# Patient Record
Sex: Female | Born: 1975 | Race: Black or African American | Hispanic: No | Marital: Single | Smoking: Current some day smoker
Health system: Southern US, Community
[De-identification: ages and names within clinical notes are randomized; demographics above are authoritative.]

## PROBLEM LIST (undated history)

## (undated) ENCOUNTER — Inpatient Hospital Stay (HOSPITAL_COMMUNITY): Payer: Self-pay

## (undated) ENCOUNTER — Inpatient Hospital Stay (HOSPITAL_COMMUNITY): Payer: Medicaid Other | Admitting: Obstetrics & Gynecology

## (undated) DIAGNOSIS — F329 Major depressive disorder, single episode, unspecified: Secondary | ICD-10-CM

## (undated) DIAGNOSIS — F32A Depression, unspecified: Secondary | ICD-10-CM

## (undated) DIAGNOSIS — F1010 Alcohol abuse, uncomplicated: Secondary | ICD-10-CM

## (undated) HISTORY — PX: LEG SURGERY: SHX1003

---

## 1997-12-01 ENCOUNTER — Emergency Department (HOSPITAL_COMMUNITY): Admission: EM | Admit: 1997-12-01 | Discharge: 1997-12-01 | Payer: Self-pay | Admitting: Emergency Medicine

## 1999-01-04 ENCOUNTER — Emergency Department (HOSPITAL_COMMUNITY): Admission: EM | Admit: 1999-01-04 | Discharge: 1999-01-04 | Payer: Self-pay | Admitting: Emergency Medicine

## 1999-02-20 ENCOUNTER — Emergency Department (HOSPITAL_COMMUNITY): Admission: EM | Admit: 1999-02-20 | Discharge: 1999-02-20 | Payer: Self-pay | Admitting: Emergency Medicine

## 1999-02-20 ENCOUNTER — Encounter: Payer: Self-pay | Admitting: Emergency Medicine

## 1999-09-17 ENCOUNTER — Emergency Department (HOSPITAL_COMMUNITY): Admission: EM | Admit: 1999-09-17 | Discharge: 1999-09-17 | Payer: Self-pay | Admitting: Emergency Medicine

## 2002-07-07 ENCOUNTER — Encounter: Payer: Self-pay | Admitting: Emergency Medicine

## 2002-07-07 ENCOUNTER — Emergency Department (HOSPITAL_COMMUNITY): Admission: EM | Admit: 2002-07-07 | Discharge: 2002-07-07 | Payer: Self-pay | Admitting: Emergency Medicine

## 2003-01-20 ENCOUNTER — Emergency Department (HOSPITAL_COMMUNITY): Admission: AD | Admit: 2003-01-20 | Discharge: 2003-01-20 | Payer: Self-pay | Admitting: Emergency Medicine

## 2003-10-23 ENCOUNTER — Inpatient Hospital Stay (HOSPITAL_COMMUNITY): Admission: AD | Admit: 2003-10-23 | Discharge: 2003-10-23 | Payer: Self-pay | Admitting: Obstetrics and Gynecology

## 2004-01-11 ENCOUNTER — Inpatient Hospital Stay (HOSPITAL_COMMUNITY): Admission: AD | Admit: 2004-01-11 | Discharge: 2004-01-15 | Payer: Self-pay | Admitting: Obstetrics and Gynecology

## 2004-01-11 ENCOUNTER — Ambulatory Visit: Payer: Self-pay | Admitting: Obstetrics and Gynecology

## 2004-01-13 ENCOUNTER — Ambulatory Visit: Payer: Self-pay | Admitting: Obstetrics and Gynecology

## 2004-05-26 ENCOUNTER — Emergency Department (HOSPITAL_COMMUNITY): Admission: EM | Admit: 2004-05-26 | Discharge: 2004-05-26 | Payer: Self-pay | Admitting: Emergency Medicine

## 2005-07-13 ENCOUNTER — Ambulatory Visit (HOSPITAL_COMMUNITY): Admission: RE | Admit: 2005-07-13 | Discharge: 2005-07-13 | Payer: Self-pay | Admitting: *Deleted

## 2005-07-13 ENCOUNTER — Ambulatory Visit: Payer: Self-pay | Admitting: *Deleted

## 2005-07-27 ENCOUNTER — Ambulatory Visit (HOSPITAL_COMMUNITY): Admission: RE | Admit: 2005-07-27 | Discharge: 2005-07-27 | Payer: Self-pay | Admitting: *Deleted

## 2005-08-03 ENCOUNTER — Inpatient Hospital Stay (HOSPITAL_COMMUNITY): Admission: AD | Admit: 2005-08-03 | Discharge: 2005-08-04 | Payer: Self-pay | Admitting: Obstetrics and Gynecology

## 2005-08-03 ENCOUNTER — Ambulatory Visit: Payer: Self-pay | Admitting: Certified Nurse Midwife

## 2005-08-26 ENCOUNTER — Inpatient Hospital Stay: Admission: AD | Admit: 2005-08-26 | Discharge: 2005-08-26 | Payer: Self-pay | Admitting: Obstetrics & Gynecology

## 2005-08-26 ENCOUNTER — Ambulatory Visit: Payer: Self-pay | Admitting: Obstetrics and Gynecology

## 2005-11-13 ENCOUNTER — Inpatient Hospital Stay (HOSPITAL_COMMUNITY): Admission: AD | Admit: 2005-11-13 | Discharge: 2005-11-13 | Payer: Self-pay | Admitting: Family Medicine

## 2005-11-28 ENCOUNTER — Ambulatory Visit: Payer: Self-pay | Admitting: Obstetrics and Gynecology

## 2005-11-28 ENCOUNTER — Inpatient Hospital Stay (HOSPITAL_COMMUNITY): Admission: AD | Admit: 2005-11-28 | Discharge: 2005-11-28 | Payer: Self-pay | Admitting: *Deleted

## 2005-12-28 ENCOUNTER — Inpatient Hospital Stay (HOSPITAL_COMMUNITY): Admission: AD | Admit: 2005-12-28 | Discharge: 2005-12-29 | Payer: Self-pay | Admitting: Obstetrics and Gynecology

## 2005-12-29 ENCOUNTER — Inpatient Hospital Stay (HOSPITAL_COMMUNITY): Admission: AD | Admit: 2005-12-29 | Discharge: 2006-01-01 | Payer: Self-pay | Admitting: Family Medicine

## 2005-12-29 ENCOUNTER — Ambulatory Visit: Payer: Self-pay | Admitting: Certified Nurse Midwife

## 2006-01-30 ENCOUNTER — Emergency Department (HOSPITAL_COMMUNITY): Admission: EM | Admit: 2006-01-30 | Discharge: 2006-01-30 | Payer: Self-pay | Admitting: Emergency Medicine

## 2006-04-22 ENCOUNTER — Emergency Department (HOSPITAL_COMMUNITY): Admission: EM | Admit: 2006-04-22 | Discharge: 2006-04-22 | Payer: Self-pay | Admitting: Emergency Medicine

## 2006-04-26 ENCOUNTER — Inpatient Hospital Stay (HOSPITAL_COMMUNITY): Admission: EM | Admit: 2006-04-26 | Discharge: 2006-04-30 | Payer: Self-pay | Admitting: *Deleted

## 2006-04-26 ENCOUNTER — Ambulatory Visit: Payer: Self-pay | Admitting: *Deleted

## 2006-07-22 ENCOUNTER — Emergency Department (HOSPITAL_COMMUNITY): Admission: EM | Admit: 2006-07-22 | Discharge: 2006-07-22 | Payer: Self-pay | Admitting: Emergency Medicine

## 2007-08-22 ENCOUNTER — Emergency Department (HOSPITAL_COMMUNITY): Admission: EM | Admit: 2007-08-22 | Discharge: 2007-08-22 | Payer: Self-pay | Admitting: Emergency Medicine

## 2007-09-12 ENCOUNTER — Ambulatory Visit (HOSPITAL_BASED_OUTPATIENT_CLINIC_OR_DEPARTMENT_OTHER): Admission: RE | Admit: 2007-09-12 | Discharge: 2007-09-12 | Payer: Self-pay | Admitting: Orthopedic Surgery

## 2008-02-01 ENCOUNTER — Emergency Department (HOSPITAL_COMMUNITY): Admission: EM | Admit: 2008-02-01 | Discharge: 2008-02-01 | Payer: Self-pay | Admitting: Emergency Medicine

## 2008-03-11 ENCOUNTER — Emergency Department (HOSPITAL_COMMUNITY): Admission: EM | Admit: 2008-03-11 | Discharge: 2008-03-11 | Payer: Self-pay | Admitting: Emergency Medicine

## 2008-03-13 ENCOUNTER — Emergency Department (HOSPITAL_COMMUNITY): Admission: EM | Admit: 2008-03-13 | Discharge: 2008-03-13 | Payer: Self-pay | Admitting: Family Medicine

## 2008-06-02 ENCOUNTER — Emergency Department (HOSPITAL_COMMUNITY): Admission: EM | Admit: 2008-06-02 | Discharge: 2008-06-02 | Payer: Self-pay | Admitting: Emergency Medicine

## 2009-08-13 ENCOUNTER — Emergency Department (HOSPITAL_COMMUNITY): Admission: EM | Admit: 2009-08-13 | Discharge: 2009-08-13 | Payer: Self-pay | Admitting: Emergency Medicine

## 2009-12-24 ENCOUNTER — Emergency Department (HOSPITAL_COMMUNITY): Admission: EM | Admit: 2009-12-24 | Discharge: 2009-12-24 | Payer: Self-pay | Admitting: Emergency Medicine

## 2010-01-10 ENCOUNTER — Emergency Department (HOSPITAL_COMMUNITY): Admission: EM | Admit: 2010-01-10 | Discharge: 2010-01-11 | Payer: Self-pay | Admitting: Emergency Medicine

## 2010-03-05 ENCOUNTER — Emergency Department (HOSPITAL_COMMUNITY): Admission: EM | Admit: 2010-03-05 | Discharge: 2010-03-05 | Payer: Self-pay | Admitting: Emergency Medicine

## 2010-03-09 ENCOUNTER — Emergency Department (HOSPITAL_COMMUNITY): Admission: EM | Admit: 2010-03-09 | Discharge: 2010-03-09 | Payer: Self-pay | Admitting: Emergency Medicine

## 2010-04-11 ENCOUNTER — Emergency Department (HOSPITAL_COMMUNITY): Admission: EM | Admit: 2010-04-11 | Discharge: 2010-04-11 | Payer: Self-pay | Admitting: Emergency Medicine

## 2010-05-09 ENCOUNTER — Emergency Department (HOSPITAL_COMMUNITY)
Admission: EM | Admit: 2010-05-09 | Discharge: 2010-05-10 | Payer: Self-pay | Source: Home / Self Care | Admitting: Emergency Medicine

## 2010-06-06 ENCOUNTER — Emergency Department (HOSPITAL_COMMUNITY)
Admission: EM | Admit: 2010-06-06 | Discharge: 2010-06-06 | Payer: Self-pay | Source: Home / Self Care | Admitting: Emergency Medicine

## 2010-06-21 ENCOUNTER — Encounter: Payer: Self-pay | Admitting: *Deleted

## 2010-08-10 LAB — GC/CHLAMYDIA PROBE AMP, GENITAL: Chlamydia, DNA Probe: NEGATIVE

## 2010-08-10 LAB — URINALYSIS, ROUTINE W REFLEX MICROSCOPIC
Hgb urine dipstick: NEGATIVE
Nitrite: NEGATIVE

## 2010-08-10 LAB — POCT I-STAT, CHEM 8
BUN: 10 mg/dL (ref 6–23)
Calcium, Ion: 1.03 mmol/L — ABNORMAL LOW (ref 1.12–1.32)
Glucose, Bld: 83 mg/dL (ref 70–99)
Potassium: 4.2 mEq/L (ref 3.5–5.1)
Sodium: 141 mEq/L (ref 135–145)
TCO2: 28 mmol/L (ref 0–100)

## 2010-08-10 LAB — POCT PREGNANCY, URINE: Preg Test, Ur: NEGATIVE

## 2010-08-10 LAB — WET PREP, GENITAL

## 2010-08-11 LAB — URINALYSIS, ROUTINE W REFLEX MICROSCOPIC
Bilirubin Urine: NEGATIVE
Glucose, UA: NEGATIVE mg/dL
Hgb urine dipstick: NEGATIVE
Ketones, ur: NEGATIVE mg/dL
Protein, ur: NEGATIVE mg/dL
pH: 5.5 (ref 5.0–8.0)

## 2010-08-11 LAB — POCT I-STAT, CHEM 8
Calcium, Ion: 1.07 mmol/L — ABNORMAL LOW (ref 1.12–1.32)
Creatinine, Ser: 1.2 mg/dL (ref 0.4–1.2)
HCT: 39 % (ref 36.0–46.0)
Potassium: 3.9 mEq/L (ref 3.5–5.1)

## 2010-08-11 LAB — URINE CULTURE

## 2010-08-15 LAB — COMPREHENSIVE METABOLIC PANEL
Alkaline Phosphatase: 44 U/L (ref 39–117)
CO2: 26 mEq/L (ref 19–32)
GFR calc non Af Amer: >60 mL/min (ref >60)
Potassium: 3.9 mEq/L (ref 3.5–5.1)
Sodium: 137 mEq/L (ref 135–145)
Total Protein: 7.3 g/dL (ref 6.0–8.3)

## 2010-08-15 LAB — CBC
Hemoglobin: 12.3 g/dL (ref 12.0–15.0)
MCH: 29 pg (ref 26.0–34.0)
MCHC: 33.6 g/dL (ref 30.0–36.0)
Platelets: 209 10*3/uL (ref 150–400)
RBC: 4.26 MIL/uL (ref 3.87–5.11)
RDW: 14.5 % (ref 11.5–15.5)

## 2010-08-15 LAB — URINALYSIS, ROUTINE W REFLEX MICROSCOPIC
Bilirubin Urine: NEGATIVE
Glucose, UA: NEGATIVE mg/dL
Hgb urine dipstick: NEGATIVE
Nitrite: NEGATIVE
Protein, ur: NEGATIVE mg/dL
Specific Gravity, Urine: 1.017 (ref 1.005–1.030)
pH: 5.5 (ref 5.0–8.0)

## 2010-08-15 LAB — D-DIMER, QUANTITATIVE: D-Dimer, Quant: <0.22 ug/mL-FEU (ref 0.00–0.48)

## 2010-08-15 LAB — DIFFERENTIAL
Basophils Relative: 0 % (ref 0–1)
Eosinophils Relative: 2 % (ref 0–5)
Monocytes Relative: 5 % (ref 3–12)
Neutro Abs: 4.3 10*3/uL (ref 1.7–7.7)
Neutrophils Relative %: 69 % (ref 43–77)

## 2010-08-15 LAB — POCT CARDIAC MARKERS
Myoglobin, poc: 43.3 ng/mL (ref 12–200)
Troponin i, poc: <0.05 ng/mL (ref 0.00–0.09)

## 2010-08-19 ENCOUNTER — Inpatient Hospital Stay (HOSPITAL_COMMUNITY)
Admission: AD | Admit: 2010-08-19 | Discharge: 2010-08-23 | DRG: 897 | Disposition: A | Discharge status: Home / Self Care | Payer: PRIVATE HEALTH INSURANCE | Attending: Psychiatry | Admitting: Psychiatry

## 2010-08-19 DIAGNOSIS — F1020 Alcohol dependence, uncomplicated: Principal | ICD-10-CM

## 2010-08-19 DIAGNOSIS — F1411 Cocaine abuse, in remission: Secondary | ICD-10-CM

## 2010-08-19 DIAGNOSIS — Z87440 Personal history of urinary (tract) infections: Secondary | ICD-10-CM

## 2010-08-20 DIAGNOSIS — F1020 Alcohol dependence, uncomplicated: Secondary | ICD-10-CM

## 2010-08-20 LAB — URINALYSIS, ROUTINE W REFLEX MICROSCOPIC
Glucose, UA: NEGATIVE mg/dL
Ketones, ur: NEGATIVE mg/dL
Protein, ur: NEGATIVE mg/dL

## 2010-08-20 LAB — COMPREHENSIVE METABOLIC PANEL
ALT: 41 U/L — ABNORMAL HIGH (ref 0–35)
Alkaline Phosphatase: 47 U/L (ref 39–117)
CO2: 28 mEq/L (ref 19–32)
GFR calc non Af Amer: >60 mL/min (ref >60)
Glucose, Bld: 120 mg/dL — ABNORMAL HIGH (ref 70–99)
Potassium: 4 mEq/L (ref 3.5–5.1)
Sodium: 137 mEq/L (ref 135–145)

## 2010-08-23 LAB — URINALYSIS, ROUTINE W REFLEX MICROSCOPIC
Bilirubin Urine: NEGATIVE
Hgb urine dipstick: NEGATIVE
Ketones, ur: NEGATIVE mg/dL
Nitrite: NEGATIVE
pH: 6 (ref 5.0–8.0)

## 2010-08-23 LAB — PREGNANCY, URINE: Preg Test, Ur: NEGATIVE

## 2010-08-23 LAB — URINE MICROSCOPIC-ADD ON

## 2010-08-26 NOTE — Discharge Summary (Signed)
  NAMEHANIYAH, MACIOLEK               ACCOUNT NO.:  [id number]  MEDICAL RECORD NO.:  [id number]           PATIENT TYPE:  I  LOCATION:  0300                          FACILITY:  [facility]  PHYSICIAN:  Anselm Jungling, MD  DATE OF BIRTH:  [birth year]  DATE OF ADMISSION:  08/19/2010 DATE OF DISCHARGE:  08/23/2010                              DISCHARGE SUMMARY   IDENTIFYING DATA AND REASON FOR ADMISSION:  This was an inpatient psychiatric admission for Jaree, a 35 year old, single, African American female, who was referred by [facility] for treatment of alcohol dependence.  Please refer to the admission note for further details pertaining to the symptoms, circumstances, and history that led to her hospitalization.  She was given an initial AXIS I diagnosis of alcohol dependence.  MEDICAL AND LABORATORY:  The patient was medically and physically assessed by the psychiatric nurse practitioner.  She was in good health without any active or chronic medical problems.  There were no significant medical issues during this stay, outside of her medical detoxification for alcohol dependence.  HOSPITAL COURSE:  The patient was admitted to the adult inpatient psychiatric service.  She presented as a well-nourished, normally- developed, adult woman, who was pleasant, cooperative, and fully oriented, without any signs or symptoms of psychosis, delirium, or thought disorder.  Her mood was sad.  There was no suicidal ideation at any time during her stay.  She was still participating in the treatment program, which included therapeutic groups and activities geared towards 12-step recovery.  Her detoxification, which was mediated by a standard Librium taper, was relatively uneventful.  She was interested in returning to [facility] for residential treatment, but they were unwilling to accept her, due to the nature of her behavior at their facility when she initially presented there, that is, she  was slightly agitated and intoxicated.  However, she did agree to the following aftercare plan.  AFTERCARE:  The patient was to follow up with [facility] outpatient program on August 26, 2010.  DISCHARGE MEDICATIONS:  None.  DISCHARGE DIAGNOSES:  AXIS I:  Alcohol dependence.  AXIS II:  Deferred.  AXIS III:  No acute or chronic illnesses.  AXIS IV:  Stressors severe.  AXIS V:  Global Assessment of Functioning on discharge 50.  A suicide risk assessment was completed at the time of discharge and she was felt to be at minimal risk.     Anselm Jungling, MD     SPB/MEDQ  D:  08/24/2010  T:  08/24/2010  Job:  [id number]  Electronically Signed by Geralyn Flash MD on 08/26/2010 07:44:21 AM

## 2010-09-02 ENCOUNTER — Emergency Department (HOSPITAL_COMMUNITY)
Admission: EM | Admit: 2010-09-02 | Discharge: 2010-09-02 | Discharge status: Against Medical Advice | Payer: Medicaid Other | Attending: Emergency Medicine | Admitting: Emergency Medicine

## 2010-09-02 DIAGNOSIS — M79609 Pain in unspecified limb: Secondary | ICD-10-CM | POA: Insufficient documentation

## 2010-09-02 DIAGNOSIS — R109 Unspecified abdominal pain: Secondary | ICD-10-CM | POA: Insufficient documentation

## 2010-09-30 NOTE — H&P (Signed)
[name], HASAN               ACCOUNT NO.:  [id number]  MEDICAL RECORD NO.:  [id number]           PATIENT TYPE:  I  LOCATION:  0300                          [facility]ACILITY:  [facility]  PHYSICIAN:  Anselm Jungling, MD  DATE OF BIRTH:  [birth year]  DATE OF ADMISSION:  08/19/2010 DATE OF DISCHARGE:                      PSYCHIATRIC ADMISSION ASSESSMENT   IDENTIFICATION:  A 35 year old African-American female single.  This is a voluntary admission.  HISTORY OF PRESENT ILLNESS:  Breeann presents requesting detox from alcohol.  She has been drinking alcohol with her last drink on Tuesday March 20.  Says that she was getting anxious and shaky and was trying to stop on her own but felt that she could not.  She has been drinking for about a year, not drinking large amounts most every day.  She is motivated for sobriety by wanting to get her children back from the custody of her stepmother.  She denies any suicidal thoughts.  No homicidal thoughts.  PAST PSYCHIATRIC HISTORY:  Previous admission to [facility] in December 2007, also for mood disorder, having a lot of anger and rages.  She has a history of alcohol abuse with her longest period of sobriety being 5 years in the past and a history of cocaine abuse with 8 consecutive years of abstinence now.  She was previously treated at [facility] for substance abuse.  She has a history of previous medication trials of Depakote, Lexapro and Risperdal at various times.  Currently on no medications.  No current outpatient treatment.  SOCIAL HISTORY:  Single African-American female who has children in the custody of her stepmother.  She is unemployed and no current legal charges.  FAMILY HISTORY:  Mother with history of polysubstance abuse.  MEDICAL HISTORY:  No regular primary care provider.  CURRENT MEDICAL PROBLEMS:  None.  PAST MEDICAL HISTORY: 1. Left knee surgery. 2. UTI. 3. No history of seizures or delirium.  CURRENT MEDICATIONS:   Implanon  implant in her left arm for contraception.  PHYSICAL EXAM:  Was done here on the unit and is noted in the record. This is a slim-built African-American female in no physical distress today and does not appear to be having any problems with acute withdrawal.  She is 5 feet 6 inches tall and 64 kg in weight. ADMITTING VITAL SIGNS:  Temperature 94, pulse 90, respirations 16, blood pressure 113/70.  Pregnancy test and metabolic panel are currently pending.  Healthy in appearance.  Full PE is noted in the record and is negative for any significant findings. NEURO EXAM:  Is nonfocal.  Motor is smooth and no ataxia, tremor or acute signs of withdrawal.  MENTAL STATUS EXAM:  Fully alert female, good eye contact.  Grooming appropriate, calm, cooperative, asking for help getting off alcohol and would like to be sober.  Mood neutral.  Thinking logical.  No psychosis. No signs of delirium or confusion.  Cognitively she is completely intact.  Clearly and convincingly denying any dangerous ideas.  AXIS I:  Alcohol abuse and dependence.   Cocaine abuse in remission. AXIS II:  Deferred. AXIS III: No diagnosis. AXIS IV:  Significant to parenting  issues and social issues, having supportive family is an asset. AXIS V:  Current is 50, past year not known.  PLAN:  To voluntarily admit her with a goal of a safe detox.  We have placed her on a Librium tapering dose which she is tolerating well. Participation in group therapy is good.  She is complaining of some mild dysuria, and we will get a C-met and a UA and a urine pregnancy test.     Young Berry. Lorin Picket, N.P.   ______________________________ Anselm Jungling, MD    MAS/MEDQ  D:  08/20/2010  T:  08/20/2010  Job:  [id number]  Electronically Signed by Kari Baars N.P. on 09/29/2010 01:48:41 PM Electronically Signed by Geralyn Flash MD on 09/30/2010 12:14:33 PM

## 2010-10-13 NOTE — Op Note (Signed)
[name], [name]               ACCOUNT NO.:  [id number]   MEDICAL RECORD NO.:  [id number]          PATIENT TYPE:  AMB   LOCATION:  DSC                          FACILITY:  [facility]   PHYSICIAN:  Eulas Post, MD    DATE OF BIRTH:  Jan 07, 1976   DATE OF PROCEDURE:  09/12/2007  DATE OF DISCHARGE:                               OPERATIVE REPORT   PREOPERATIVE DIAGNOSIS:  Left medial meniscus tear.   POSTOPERATIVE DIAGNOSIS:  Left medial meniscus tear.   OPERATIVE PROCEDURE:  Partial left medial meniscectomy.   ANESTHESIA:  General.   ESTIMATED BLOOD LOSS:  Minimal.   PREPROCEDURE INDICATIONS:  Ms. [name] is a 35 year old African-  American woman, who complained of medial joint line tenderness.  She had  an effusion preoperatively.  She had an MRI that was of poor quality and  was not diagnostic.  She elected to undergo the above-named procedure.  The risks, benefits, and alternatives of the operative intervention were  discussed with her preoperatively including, but not limited to, risks  of infection, bleeding, nerve injury, progression of arthritis,  stiffness, recurrent knee pain, recurrent effusions, cardiopulmonary  complications, among others, and she was willing to proceed.   OPERATIVE FINDINGS:  The chondral surfaces were all essentially normal.  The medial and lateral compartments were well preserved on the tibial  and the femoral surfaces.  There were some grade 1 changes on the  lateral tibial condyle and some grade 1 changes on the medial tibial  condyle.  The lateral meniscus was normal.  The patellofemoral joint was  normal.  There was some injected synovium throughout.  There was a  moderate effusion.  The medial meniscus had significant softening as  well as what appeared to be a small rent in the undersurface of the  posterior horn of the medial meniscus.  This made the medial meniscus  somewhat unstable, and I could deliver it anteriorly past the  midpoint  of the joint with the probe.   OPERATIVE PROCEDURE:  The patient was brought to the operating room and  placed in a supine position.  General anesthesia was administered.  The  left lower extremity was prepped and draped in the usual sterile  fashion.  Exam under anesthesia demonstrated excellent stability of  varus, valgus, and Lachman maneuver.  Partial medial meniscectomy was  performed at the posterior horn.  The majority of the meniscus was left  intact.  The meniscus was debrided with an arthroscopic biter as well as  the shaver.  The loose chondral bits were removed with the shaver.  The patient was irrigated copiously and tolerated the procedure well.  The portals were closed with Monocryl, Steri-Strips, and sterile gauze,  and the patient returned to PACU in stable and satisfactory condition.  There were no complications.      Eulas Post, MD  Electronically Signed     JPL/MEDQ  D:  09/12/2007  T:  09/13/2007  Job:  [id number]
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
## 2010-10-16 NOTE — Discharge Summary (Signed)
[name], UPSHAW                         ACCOUNT NO.:  [id number]   MEDICAL RECORD NO.:  [id number]                   PATIENT TYPE:  INP   LOCATION:  9127                                 FACILITY:  [facility]   PHYSICIAN:  Phil D. Okey Dupre, M.D.                  DATE OF BIRTH:  [birth year]   DATE OF ADMISSION:  01/11/2004  DATE OF DISCHARGE:  01/15/2004                                 DISCHARGE SUMMARY   ADMITTING DIAGNOSES:  1. Early labor.  2. Bacterial vaginosis.  3. Trichomonas.  4. Positive cocaine on urine drug screen.   PROCEDURES:  Complete OB ultrasound on January 11, 2004 to evaluate for dates  and secondary to limited prenatal care.  Ultrasound shows cephalic  presentation, anterior placental location, no evidence of a previa, normal  amniotic fluid with an AFI of 11.3 cm, a mean gestational age of [redacted] weeks 5  days, estimated fetal weight of 3349 g which is in the 50th to 75th  percentile for 38 weeks.   LABORATORY DATA:  CBC on admission:  White count 6.3, hemoglobin 8.8,  hematocrit of 27.8, platelet count of 322.  Sodium 132, potassium 3.2,  chloride 103, bicarb 23, glucose 115, BUN of 6, creatinine 0.7, T bili 0.1,  alk phos of 242, AST of 21, ALT of 15, uric acid of 3.9, LDH of 154.  Urine  drug screen was positive for cocaine.  Urine protein was significant for 283  mg of protein over a 24-hour period.   Wet prep showed few clue cells, moderate white blood cell count.  A UA  showed Trichomonas in her urine.   HISTORY AND PHYSICAL:  The patient was a 35 year old G1 P0-0-0-0 who  presented at 58 and 2 weeks based on an initial ultrasound at 3 and six-  sevenths weeks which gave an estimated due date of January 23, 2004.  This  patient's chief complaint was abdominal pain and contractions beginning at 3  a.m. the morning of admission on August 13.  She reported the contractions  were every 2-3 minutes.  She denied any vaginal bleeding, rupture of  membranes, and  reported normal fetal activity.  Other significant review of  systems are a vaginal rash with no discharge but positive itching.  The  patient also admitted to using crack last night.   PAST MEDICAL HISTORY:  Prenatal care:  The patient has not had any prenatal  care.  Had one visit to [facility] at 26 weeks at which  she received an ultrasound.  Complications during this pregnancy include  crack cocaine, alcohol, tobacco, and lack of prenatal care.   PAST OBSTETRICAL HISTORY:  None.   PAST GYNECOLOGICAL HISTORY:  None.   PAST MEDICAL HISTORY:  None.   PAST SURGICAL HISTORY:  None.   SOCIAL HISTORY:  The patient lives alone, is unemployed, and has  polysubstance abuse.  MEDICATIONS:  None.   ALLERGIES:  No known drug allergies.   PRENATAL LABORATORY DATA:  The patient is A positive with a negative  antibody screen, hemoglobin of 9.2, platelets of 265, an equivocal rubella,  an negative hepatitis B surface antigen, nonreactive syphilis, negative HIV,  no documented GC/chlamydia or GBS.  Most recent ultrasound was at 26 and six-  sevenths weeks which shows a breech presentation, anterior placenta, no  previa, and AFI normal.   PHYSICAL EXAM:  VITAL SIGNS:  Temperature 97.6, pulse 86, respiratory rate  20, blood pressure 140/84.  GENERAL:  No apparent distress, alert and oriented x3.  However, the patient  is grossly intoxicated.  HEART:  Regular rate and rhythm; no murmurs, rubs, or gallops.  LUNGS:  Clear to auscultation bilaterally.  ABDOMEN:  Gravid and nontender.  EXTREMITIES:  No cyanosis, clubbing, or edema.  NEUROLOGIC:  The patient is grossly intact.  However, has brisk reflexes  with no evidence of clonus.  PELVIC:  A speculum exam showed copious white discharge with a closed os.  Digital cervix exam:  The patient was fingertip to 1, 50% effaced, -1  station.  FETAL HEART RATE:  Baseline in the 140s with positive variability and  reactivity and  no obvious decelerations.  TOCOMETER:  The patient was having contractions every 2 minutes for a  duration of 60 seconds.   For admission labs, please see the wet prep, urine drug screen, and UA  results in the above paragraph.   ASSESSMENT:  The patient is a 35 year old G1 P0 at 75 and 2 weeks with  contractions and cervicitis.  A wet prep, GC, chlamydia, GBS, UA, urine drug  screen, and complete OB ultrasound was obtained.  Also obtained a social  work consult for possible CPS intervention.   HOSPITAL COURSE:  The patient was treated with Flagyl for presumed BV as  well as Trichomonas.  On the morning of January 12, 2004 the patient began  feeling painful contractions.  On tocometer the contractions were every 2-5  minutes.  Fetal heart rate was reassuring.  The next vaginal exam showed  cervical change to 2-3 cm, -1 station, and a bulging bag of water.  The  patient was given an epidural and on August 15 at 0300 the patient had  spontaneous vaginal delivery of a viable female infant with Apgars 9 and 9  that was DeLee suctioned at the perineum.  Placenta was delivered without  complications.  There was a small labial laceration.  The mother and infant  were both grossly stable.  The patient received routine postnatal care for  an additional 2 days after vaginal delivery.  This was uncomplicated.  Gradually, lochia resolved.  Constipation gradually resolved and the patient  had a bowel movement on the day of discharge.  The patient reported wanting  to bottle feed her infant.  Requested Depo-Provera as birth control prior to  discharge.   [facility] CPS was involved and on the morning of discharge a meeting  was scheduled at 10 a.m. to discuss the discharge of the infant - either to  the home or possibly into Child Protective Services.  This is still pending  at the time of discharge.   DISCHARGE MEDICATIONS:  1. Ibuprofen 600 mg p.o. q.6h. p.r.n. pain. 2. Prenatal vitamins  one p.o. daily.  3. Senokot-S two tablets p.o. q.h.s. p.r.n. constipation.   FOLLOW-UP INSTRUCTIONS:  The patient is instructed to follow-up at [facility] in 6  weeks for routine postnatal check.  She is instructed to  refrain from intercourse for an additional 6 weeks.  She is instructed to  follow up every 3 months for Depo-Provera injections.     Broadus John T. Pamalee Leyden, MD                  Javier Glazier. Okey Dupre, M.D.    Alvia Grove  D:  01/15/2004  T:  01/15/2004  Job:  [id number]

## 2010-12-15 ENCOUNTER — Emergency Department (HOSPITAL_COMMUNITY)
Admission: EM | Admit: 2010-12-15 | Discharge: 2010-12-15 | Discharge status: Against Medical Advice | Payer: Medicaid Other | Attending: Emergency Medicine | Admitting: Emergency Medicine

## 2010-12-15 DIAGNOSIS — F3289 Other specified depressive episodes: Secondary | ICD-10-CM | POA: Insufficient documentation

## 2010-12-15 DIAGNOSIS — F329 Major depressive disorder, single episode, unspecified: Secondary | ICD-10-CM | POA: Insufficient documentation

## 2010-12-15 DIAGNOSIS — Z8711 Personal history of peptic ulcer disease: Secondary | ICD-10-CM | POA: Insufficient documentation

## 2010-12-15 DIAGNOSIS — F1010 Alcohol abuse, uncomplicated: Secondary | ICD-10-CM | POA: Insufficient documentation

## 2010-12-15 LAB — URINE MICROSCOPIC-ADD ON

## 2010-12-15 LAB — COMPREHENSIVE METABOLIC PANEL
AST: 52 U/L — ABNORMAL HIGH (ref 0–37)
Albumin: 3.9 g/dL (ref 3.5–5.2)
Alkaline Phosphatase: 49 U/L (ref 39–117)
BUN: 13 mg/dL (ref 6–23)
CO2: 24 mEq/L (ref 19–32)
Chloride: 104 mEq/L (ref 96–112)
Creatinine, Ser: 0.63 mg/dL (ref 0.50–1.10)
GFR calc non Af Amer: >60 mL/min (ref >60)
Potassium: 3.8 mEq/L (ref 3.5–5.1)
Total Bilirubin: 0.3 mg/dL (ref 0.3–1.2)

## 2010-12-15 LAB — DIFFERENTIAL
Basophils Absolute: 0 10*3/uL (ref 0.0–0.1)
Lymphocytes Relative: 23 % (ref 12–46)
Lymphs Abs: 1.3 10*3/uL (ref 0.7–4.0)
Neutro Abs: 3.9 10*3/uL (ref 1.7–7.7)
Neutrophils Relative %: 67 % (ref 43–77)

## 2010-12-15 LAB — RAPID URINE DRUG SCREEN, HOSP PERFORMED
Barbiturates: NOT DETECTED
Benzodiazepines: NOT DETECTED
Cocaine: NOT DETECTED
Opiates: NOT DETECTED

## 2010-12-15 LAB — CBC
HCT: 34.9 % — ABNORMAL LOW (ref 36.0–46.0)
MCV: 86.6 fL (ref 78.0–100.0)
RBC: 4.03 MIL/uL (ref 3.87–5.11)
WBC: 5.9 10*3/uL (ref 4.0–10.5)

## 2010-12-15 LAB — URINALYSIS, ROUTINE W REFLEX MICROSCOPIC
Bilirubin Urine: NEGATIVE
Glucose, UA: NEGATIVE mg/dL
Ketones, ur: NEGATIVE mg/dL
pH: 5.5 (ref 5.0–8.0)

## 2010-12-15 LAB — PREGNANCY, URINE: Preg Test, Ur: NEGATIVE

## 2011-02-15 ENCOUNTER — Emergency Department (HOSPITAL_COMMUNITY)
Admission: EM | Admit: 2011-02-15 | Discharge: 2011-02-15 | Discharge status: Against Medical Advice | Payer: Medicaid Other | Attending: Emergency Medicine | Admitting: Emergency Medicine

## 2011-02-15 DIAGNOSIS — F1410 Cocaine abuse, uncomplicated: Secondary | ICD-10-CM | POA: Insufficient documentation

## 2011-02-15 DIAGNOSIS — F1010 Alcohol abuse, uncomplicated: Secondary | ICD-10-CM | POA: Insufficient documentation

## 2011-02-15 LAB — DIFFERENTIAL
Eosinophils Absolute: 0.1 10*3/uL (ref 0.0–0.7)
Eosinophils Relative: 1 % (ref 0–5)
Lymphocytes Relative: 24 % (ref 12–46)
Lymphs Abs: 1.2 10*3/uL (ref 0.7–4.0)
Monocytes Absolute: 0.6 10*3/uL (ref 0.1–1.0)

## 2011-02-15 LAB — CBC
HCT: 38 % (ref 36.0–46.0)
MCH: 29.9 pg (ref 26.0–34.0)
MCHC: 34.7 g/dL (ref 30.0–36.0)
MCV: 86.2 fL (ref 78.0–100.0)
Platelets: 213 10*3/uL (ref 150–400)
RDW: 14.5 % (ref 11.5–15.5)
WBC: 5.2 10*3/uL (ref 4.0–10.5)

## 2011-02-15 LAB — COMPREHENSIVE METABOLIC PANEL
ALT: 68 U/L — ABNORMAL HIGH (ref 0–35)
AST: 61 U/L — ABNORMAL HIGH (ref 0–37)
Alkaline Phosphatase: 54 U/L (ref 39–117)
CO2: 24 mEq/L (ref 19–32)
Calcium: 10 mg/dL (ref 8.4–10.5)
Chloride: 101 mEq/L (ref 96–112)
GFR calc Af Amer: >60 mL/min (ref >60)
GFR calc non Af Amer: >60 mL/min (ref >60)
Glucose, Bld: 71 mg/dL (ref 70–99)
Potassium: 4.1 mEq/L (ref 3.5–5.1)
Sodium: 136 mEq/L (ref 135–145)
Total Bilirubin: 0.3 mg/dL (ref 0.3–1.2)

## 2011-02-15 LAB — RAPID URINE DRUG SCREEN, HOSP PERFORMED: Barbiturates: NOT DETECTED

## 2011-02-18 ENCOUNTER — Emergency Department (HOSPITAL_COMMUNITY)
Admission: EM | Admit: 2011-02-18 | Discharge: 2011-02-18 | Disposition: A | Discharge status: Other Acute Inpatient Hospital | Payer: Medicaid Other | Attending: Emergency Medicine | Admitting: Emergency Medicine

## 2011-02-18 ENCOUNTER — Inpatient Hospital Stay (HOSPITAL_COMMUNITY)
Admission: AD | Admit: 2011-02-18 | Discharge: 2011-02-24 | DRG: 897 | Disposition: A | Discharge status: Home / Self Care | Payer: Self-pay | Source: Ambulatory Visit | Attending: Psychiatry | Admitting: Psychiatry

## 2011-02-18 DIAGNOSIS — G8929 Other chronic pain: Secondary | ICD-10-CM

## 2011-02-18 DIAGNOSIS — Z5900 Homelessness unspecified: Secondary | ICD-10-CM

## 2011-02-18 DIAGNOSIS — G4700 Insomnia, unspecified: Secondary | ICD-10-CM

## 2011-02-18 DIAGNOSIS — M79609 Pain in unspecified limb: Secondary | ICD-10-CM

## 2011-02-18 DIAGNOSIS — F1020 Alcohol dependence, uncomplicated: Principal | ICD-10-CM

## 2011-02-18 DIAGNOSIS — F1910 Other psychoactive substance abuse, uncomplicated: Secondary | ICD-10-CM | POA: Insufficient documentation

## 2011-02-18 DIAGNOSIS — F17200 Nicotine dependence, unspecified, uncomplicated: Secondary | ICD-10-CM

## 2011-02-18 DIAGNOSIS — M25539 Pain in unspecified wrist: Secondary | ICD-10-CM

## 2011-02-18 DIAGNOSIS — Z87440 Personal history of urinary (tract) infections: Secondary | ICD-10-CM

## 2011-02-18 DIAGNOSIS — F1410 Cocaine abuse, uncomplicated: Secondary | ICD-10-CM

## 2011-02-18 LAB — COMPREHENSIVE METABOLIC PANEL
ALT: 52 U/L — ABNORMAL HIGH (ref 0–35)
AST: 48 U/L — ABNORMAL HIGH (ref 0–37)
Alkaline Phosphatase: 64 U/L (ref 39–117)
CO2: 25 mEq/L (ref 19–32)
Calcium: 10 mg/dL (ref 8.4–10.5)
GFR calc non Af Amer: >60 mL/min (ref >60)
Potassium: 4.1 mEq/L (ref 3.5–5.1)
Sodium: 135 mEq/L (ref 135–145)
Total Protein: 8.3 g/dL (ref 6.0–8.3)

## 2011-02-18 LAB — RAPID URINE DRUG SCREEN, HOSP PERFORMED
Barbiturates: NOT DETECTED
Benzodiazepines: NOT DETECTED

## 2011-02-18 LAB — CBC
HCT: 38.3 % (ref 36.0–46.0)
MCH: 29.2 pg (ref 26.0–34.0)
MCV: 88 fL (ref 78.0–100.0)
Platelets: 228 10*3/uL (ref 150–400)
RDW: 14.6 % (ref 11.5–15.5)
WBC: 7.3 10*3/uL (ref 4.0–10.5)

## 2011-02-19 DIAGNOSIS — F1020 Alcohol dependence, uncomplicated: Secondary | ICD-10-CM

## 2011-02-19 LAB — URINALYSIS, ROUTINE W REFLEX MICROSCOPIC
Bilirubin Urine: NEGATIVE
Glucose, UA: NEGATIVE mg/dL
Ketones, ur: NEGATIVE mg/dL
Protein, ur: NEGATIVE mg/dL
pH: 7 (ref 5.0–8.0)

## 2011-02-19 LAB — URINE MICROSCOPIC-ADD ON

## 2011-02-22 LAB — GC/CHLAMYDIA PROBE AMP, URINE: Chlamydia, Swab/Urine, PCR: NEGATIVE

## 2011-02-23 LAB — POCT HEMOGLOBIN-HEMACUE: Hemoglobin: 11.8 — ABNORMAL LOW

## 2011-02-23 LAB — URINE CULTURE: Special Requests: NEGATIVE

## 2011-02-25 NOTE — Discharge Summary (Signed)
[name], [name]               ACCOUNT NO.:  [id number]  MEDICAL RECORD NO.:  [id number]  LOCATION:  0300                          [facility]ACILITY:  [facility]  PHYSICIAN:  Orson Aloe, MD       DATE OF BIRTH:  May 20, 1976  DATE OF ADMISSION:  02/18/2011 DATE OF DISCHARGE:  02/24/2011                              DISCHARGE SUMMARY   This 35 year old, African American female is voluntarily admitted for her third [facility] admission, who presents requesting detox from substances. She denies any suicidal thoughts.  No other dangerous ideas.  She presents with a negative alcohol screen and mildly elevated liver enzymes.  She said that she would like to detox from alcohol and has been drinking five to six 40 ounce beers daily, with her last drink on the 19th.  She had been complaining of some chronic pain in her wrists and legs, but denies any trauma.  She reported using crack cocaine about once monthly and denies any other substance use.  She denies abuse of opiates and benzos.  The findings show her urine drug screen was negative for all substances.  Her CBC was normal with a hemoglobin of 12.7.  Liver enzymes were mildly elevated with an SGOT of 48 and SGPT of 52.  Alcohol screen was negative.  ADMITTING DIAGNOSES:  Axis I:  Alcohol abuse and dependence, cocaine abuse. Axis II:  No diagnosis. Axis III:  No diagnosis. Axis IV:  Deferred. Axis V: Current 46, past year not known.  The patient was upset upon initial interview in learning that she would not be able to go to [facility] until the 12th.  She had her heart set on going there before then, because she had believed it was arranged by an outpatient clinician who had promised her that.  She was admitted, placed on trazodone at nighttime for sleep, started on a Librium detox, and given nicotine patch as well as Naprosyn 500 mg twice a day.  Her urinalysis showed some bacterial growth and so Septra DS was ordered for that for 7 days.  Tylenol  1000 mg every 6 hours was ordered.  A PPD was ordered and she was found to be negative.  During the hospital course, the patient complained of some generalized pain.  She was given information about [facility] and she told that she had benefited the most from [facility] in the past.  Case manager had some contact with Mrs. Dorley and negotiated something about some referrals for her after discharge.  The patient reluctantly agreed to get a good foundation for her sobriety by going to a structured program.  [facility] program was one that came up multiple times and she was agreeable to that and she resigned herself to going there.  She was accepted at [facility] on the 25th and was discharged on the 26th.  DISCHARGE CONDITION:  She denied any suicidal or homicidal ideation. She denied any hallucinations, illusions or delusions.  She also showed good eye contact and able to focus well and one-to-one in group settings, had clear goal-directed thoughts, natural conversational speech of volume, rate and tone.  She is oriented x4, had recent and remote memory that was  intact.  Judgment was able to eliminate current environment living arrangements because it would not be safe for her to return to, and she is looking forward to going to [facility].  She had significant improvement in insight due to hospital stay.  DIAGNOSES:  Axis I:  Alcohol dependence, nicotine dependence and cocaine abuse. Axis II:  Deferred. Axis III:  No diagnosis. Axis IV:  Moderate, homeless, psychosocial issues related to substance abuse. Axis V: 55.  RECOMMENDATIONS: 1. Return to typical activity. 2. Return to typical diet. 3. Continue Septra DS, take one twice a day, 8 tablets dispensed.     Also recommended to follow up at [facility] and was picked up by the [facility]     folks at 11:30 on September 26th.          ______________________________ Orson Aloe, MD     EW/MEDQ  D:  02/24/2011  T:  02/25/2011  Job:   [id number]  Electronically Signed by Orson Aloe  on 02/25/2011 10:58:14 AM

## 2011-02-25 NOTE — Assessment & Plan Note (Signed)
  [name], [name]               ACCOUNT NO.:  [id number]  MEDICAL RECORD NO.:  [id number]  LOCATION:  0301                          FACILITY:  [facility]  PHYSICIAN:  Orson Aloe, MD       DATE OF BIRTH:  January 08, 1976  DATE OF ADMISSION:  02/18/2011 DATE OF DISCHARGE:                      PSYCHIATRIC ADMISSION ASSESSMENT   DATE OF ASSESSMENT:  February 19, 2011, at 14:35.  IDENTIFYING INFORMATION:  This is a 35 year old African American female. This is a voluntary admission.  HISTORY OF THE PRESENT ILLNESS:  This is the 3rd [facility] admission for [name] who presents requesting detox from substances.  She denies any suicidal thoughts.  No other dangerous ideas.  She presented with a negative alcohol screen and mildly elevated liver enzymes.  Said that she would like to detox from alcohol and had been drinking 5-6 forty- ounce beers daily with her last drink on the 19th.  She was also complaining of some chronic pain in her wrists and legs but denies any trauma.  She also reports use of crack cocaine about once monthly and denies other substance abuse.  Denies abuse of benzodiazepines or opiates.  PAST PSYCHIATRIC HISTORY:  No current outpatient treatment.  She has a history of a previous admission for treatment of mood disorder in 2007 here at [facility] and, more recently, was on our unit in March of 2012, also for alcohol detox.  SOCIAL HISTORY:  This is a single [place] American female who has children in the custody of her stepmother.  She is currently unemployed. No known legal problems.  MEDICAL HISTORY: 1. No regular primary care provider. 2. Past medical history:  Previous left knee surgery.  Denies a     history of seizures or delirium.  Has a past history of urinary     tract infections.  PHYSICAL EXAM:  She was medically evaluated in our [facility] emergency room.  Her urine drug screen was negative for all substances.  CBC normal with a hemoglobin of 12.7.  Liver enzymes  mildly elevated with an SGOT of 48, SGPT 52.  Alcohol screen negative.  MENTAL STATUS EXAM:  Fully alert female, poor eye contact, complaining of some generalized aches and pains.  Mood neutral.  Denying any dangerous ideas.  Thinking is logical.  No evidence of confusion or delirium.  She does not appear to be in acute alcohol withdrawal. Cognitively, she is fully intact.  Axis I:  Alcohol abuse and dependence, cocaine abuse. Axis II:  No diagnosis. Axis III:  No diagnosis. Axis IV:  Deferred. Axis V:  Current 46, past year not known.  PLAN:  Voluntarily admit her with a goal of a safe detox, and we will start her on a Librium detox protocol which, so far, she is tolerating well.  We have given her trazodone 50 mg h.s. p.r.n. insomnia.     Margaret A. Lorin Picket, N.P.   ______________________________ Orson Aloe, MD    MAS/MEDQ  D:  02/19/2011  T:  02/19/2011  Job:  [id number]  Electronically Signed by Kari Baars N.P. on 02/24/2011 05:06:16 PM Electronically Signed by Orson Aloe  on 02/25/2011 10:58:03 AM

## 2011-03-03 LAB — URINE MICROSCOPIC-ADD ON

## 2011-03-03 LAB — URINALYSIS, ROUTINE W REFLEX MICROSCOPIC
Bilirubin Urine: NEGATIVE
Glucose, UA: NEGATIVE
Nitrite: NEGATIVE
Specific Gravity, Urine: 1.022
pH: 6

## 2011-03-03 LAB — ETHANOL: Alcohol, Ethyl (B): <5

## 2011-03-03 LAB — CBC
HCT: 34.4 — ABNORMAL LOW
Hemoglobin: 11.1 — ABNORMAL LOW
RBC: 4.15
RDW: 14.8
WBC: 9.4

## 2011-03-03 LAB — DIFFERENTIAL
Basophils Absolute: 0.1
Lymphocytes Relative: 20
Lymphs Abs: 1.8
Monocytes Absolute: 0.7
Monocytes Relative: 8
Neutro Abs: 6.7

## 2011-03-03 LAB — RAPID URINE DRUG SCREEN, HOSP PERFORMED
Amphetamines: NOT DETECTED
Barbiturates: NOT DETECTED
Tetrahydrocannabinol: NOT DETECTED

## 2011-03-03 LAB — POCT I-STAT, CHEM 8
Chloride: 105
Glucose, Bld: 104 — ABNORMAL HIGH
HCT: 35 — ABNORMAL LOW
Potassium: 3.5

## 2011-06-03 ENCOUNTER — Emergency Department (HOSPITAL_COMMUNITY)
Admission: EM | Admit: 2011-06-03 | Discharge: 2011-06-03 | Discharge status: Against Medical Advice | Payer: Medicaid Other | Attending: Emergency Medicine | Admitting: Emergency Medicine

## 2011-06-03 ENCOUNTER — Encounter: Payer: Self-pay | Admitting: Emergency Medicine

## 2011-06-03 DIAGNOSIS — Z0389 Encounter for observation for other suspected diseases and conditions ruled out: Secondary | ICD-10-CM | POA: Insufficient documentation

## 2011-06-03 NOTE — ED Notes (Signed)
PT. REPORTS PERSISTENT UPPER THIGH PAIN FOR 6 MONTHS , AMBULATORY, DENIES RECENT FALL OR INJURY.

## 2011-07-24 ENCOUNTER — Emergency Department (INDEPENDENT_AMBULATORY_CARE_PROVIDER_SITE_OTHER)
Admission: EM | Admit: 2011-07-24 | Discharge: 2011-07-24 | Disposition: A | Discharge status: Home Health | Payer: Medicaid Other | Source: Home / Self Care

## 2011-07-24 ENCOUNTER — Encounter (HOSPITAL_COMMUNITY): Payer: Self-pay | Admitting: Emergency Medicine

## 2011-07-24 DIAGNOSIS — N912 Amenorrhea, unspecified: Secondary | ICD-10-CM

## 2011-07-24 DIAGNOSIS — M79643 Pain in unspecified hand: Secondary | ICD-10-CM

## 2011-07-24 DIAGNOSIS — M79609 Pain in unspecified limb: Secondary | ICD-10-CM

## 2011-07-24 LAB — POCT URINALYSIS DIP (DEVICE)
Glucose, UA: NEGATIVE mg/dL
Ketones, ur: NEGATIVE mg/dL
Specific Gravity, Urine: 1.01 (ref 1.005–1.030)

## 2011-07-24 LAB — POCT PREGNANCY, URINE: Preg Test, Ur: NEGATIVE

## 2011-07-24 NOTE — ED Provider Notes (Signed)
[name] is a 36 y.o. female who presents to Urgent Care today for amenorrhea and hand pain.  1) amenorrhea. Had a period in late December. Her Implanon was removed January 8 and she has not had a menstrual cycle since then. She has had several home pregnancy tests that have been negative. She wonders if she may be pregnant.  2) bilateral hand pain mild at night for the last several months. No swelling tingling numbness or weakness. No history of rheumatoid arthritis or family history of any autoimmune disorder.  No pain in the morning or when using her hands. She has not tried any medicines yet.   PMH reviewed.  ROS as above otherwise neg Medications reviewed. No current facility-administered medications for this encounter.   Current Outpatient Prescriptions  Medication Sig Dispense Refill  . Multiple Vitamin (MULITIVITAMIN WITH MINERALS) TABS Take 1 tablet by mouth daily.          Exam:  BP 134/84  Pulse 90  Temp(Src) 98.3 F (36.8 C) (Oral)  Resp 16  SpO2 100% Gen: Well NAD HAND: Completely normal appearing. No swelling synovitis difficulty with motion numbness weakness impaired capillary refill.  Urinary pregnancy test is negative  Assessment and Plan: 36 year old woman with amenorrhea and hand pain  1) amenorrhea likely late effect of Implanon. Is not pregnant today.  Recommend continued watchful waiting. She will likely have a menstrual cycle at some point in the next few weeks. If she continues to experience amenorrhea I recommend continued pregnancy test every few weeks and followup with gynecology as needed.  2) hand pain: Likely mild soreness at night following use of her hands during the day. I discussed that this is often a normal finding and that she can try ibuprofen as needed for pain. Discussed that if the pain worsens or does not respond ibuprofen she should return to a physician.     Clementeen Graham, MD 07/24/11 1626

## 2011-07-24 NOTE — Discharge Instructions (Signed)
Thank you for coming in today. Try taking 2 ibuprofen pills at night for hand pain. I think it is normal to have irregular periods after removing the Implanon. If you are trying to get pregnant please get some over-the-counter prenatal vitamins and take them everyday. Please call [phone] pounds and to get set up with a doctor in the area

## 2011-07-24 NOTE — ED Provider Notes (Signed)
Medical screening examination/treatment/procedure(s) were performed by a resident physician and as supervising physician I was immediately available for consultation/collaboration.  Leslee Home, M.D.   Roque Lias, MD 07/24/11 2035

## 2011-07-24 NOTE — ED Notes (Signed)
Reports possibly pregnant.  Hand hurts, hip pain.

## 2011-09-08 ENCOUNTER — Emergency Department (HOSPITAL_COMMUNITY)
Admission: EM | Admit: 2011-09-08 | Discharge: 2011-09-08 | Disposition: A | Discharge status: Home / Self Care | Payer: Medicaid Other | Source: Home / Self Care | Attending: Family Medicine | Admitting: Family Medicine

## 2012-03-21 ENCOUNTER — Emergency Department (HOSPITAL_COMMUNITY)
Admission: EM | Admit: 2012-03-21 | Discharge: 2012-03-21 | Discharge status: Absent without leave | Payer: Self-pay | Source: Home / Self Care

## 2012-06-20 ENCOUNTER — Emergency Department (HOSPITAL_COMMUNITY)
Admission: EM | Admit: 2012-06-20 | Discharge: 2012-06-21 | Disposition: A | Discharge status: Other Acute Inpatient Hospital | Payer: Medicaid Other | Attending: Emergency Medicine | Admitting: Emergency Medicine

## 2012-06-20 ENCOUNTER — Encounter (HOSPITAL_COMMUNITY): Payer: Self-pay

## 2012-06-20 DIAGNOSIS — F411 Generalized anxiety disorder: Secondary | ICD-10-CM | POA: Insufficient documentation

## 2012-06-20 DIAGNOSIS — R450 Nervousness: Secondary | ICD-10-CM | POA: Insufficient documentation

## 2012-06-20 DIAGNOSIS — F1020 Alcohol dependence, uncomplicated: Secondary | ICD-10-CM

## 2012-06-20 DIAGNOSIS — R110 Nausea: Secondary | ICD-10-CM | POA: Insufficient documentation

## 2012-06-20 DIAGNOSIS — Z3201 Encounter for pregnancy test, result positive: Secondary | ICD-10-CM | POA: Insufficient documentation

## 2012-06-20 DIAGNOSIS — F17200 Nicotine dependence, unspecified, uncomplicated: Secondary | ICD-10-CM | POA: Insufficient documentation

## 2012-06-20 DIAGNOSIS — I10 Essential (primary) hypertension: Secondary | ICD-10-CM | POA: Insufficient documentation

## 2012-06-20 LAB — RAPID URINE DRUG SCREEN, HOSP PERFORMED
Amphetamines: NOT DETECTED
Barbiturates: NOT DETECTED
Tetrahydrocannabinol: NOT DETECTED

## 2012-06-20 LAB — ETHANOL: Alcohol, Ethyl (B): 46 mg/dL — ABNORMAL HIGH (ref 0–11)

## 2012-06-20 LAB — COMPREHENSIVE METABOLIC PANEL
ALT: 12 U/L (ref 0–35)
AST: 27 U/L (ref 0–37)
Calcium: 9.8 mg/dL (ref 8.4–10.5)
Creatinine, Ser: 0.44 mg/dL — ABNORMAL LOW (ref 0.50–1.10)
Sodium: 137 mEq/L (ref 135–145)
Total Protein: 7.7 g/dL (ref 6.0–8.3)

## 2012-06-20 LAB — CBC
Hemoglobin: 11.4 g/dL — ABNORMAL LOW (ref 12.0–15.0)
RBC: 3.87 MIL/uL (ref 3.87–5.11)

## 2012-06-20 LAB — POCT PREGNANCY, URINE: Preg Test, Ur: POSITIVE — AB

## 2012-06-20 MED ORDER — ONDANSETRON HCL 8 MG PO TABS: 4.0000 mg | ORAL_TABLET | Freq: Three times a day (TID) | ORAL | Status: DC | PRN

## 2012-06-20 MED ORDER — IBUPROFEN 200 MG PO TABS: 600.0000 mg | ORAL_TABLET | Freq: Three times a day (TID) | ORAL | Status: DC | PRN

## 2012-06-20 MED ORDER — ALUM & MAG HYDROXIDE-SIMETH 200-200-20 MG/5ML PO SUSP: 30.0000 mL | ORAL | Status: DC | PRN

## 2012-06-20 MED ORDER — NICOTINE 21 MG/24HR TD PT24
21.0000 mg | MEDICATED_PATCH | Freq: Every day | TRANSDERMAL | Status: DC
  Administered 2012-06-20: 21 mg via TRANSDERMAL
  Filled 2012-06-20: qty 1

## 2012-06-20 NOTE — ED Notes (Signed)
Pt reports drinking 8 beers a day for past year. States that she has been dealing with personal and financial stress and has been drinking beer to cope with the stress. Pt denies recreational drug use. Also denies suicidal and homicidal ideations. Pt wanded by security.

## 2012-06-20 NOTE — ED Notes (Signed)
Pt. Is requesting help with alcohol.   Pt. Reports increased stress.  She would like something for her mood swings,  Hot tflashes.  She also may be pregnant.  Pt. s last drink  Is beer.

## 2012-06-20 NOTE — ED Provider Notes (Signed)
History     CSN: [id number]  Arrival date & time 06/20/12  1750   First MD Initiated Contact with Patient 06/20/12 2018      Chief Complaint  Patient presents with  . Drug / Alcohol Assessment    (Consider location/radiation/quality/duration/timing/severity/associated sxs/prior treatment) HPI Comments: 37 year old female presents emergency room and requesting detox from alcohol. She's been drinking constantly for the past 2 years and admits to drinking about 6-8 40 ounce beers per day. She went through detox about 3 years ago and was sent to [facility] from behavioral health, was able to stay sober for about a year until she relapsed. Denies history of DTs. She does not believe she can detox on her own without being at behavioral health. Also states it is a possibility she could be pregnant, however she is unsure how far along she may be. Last menstrual period was sometime in December. Admits to slight morning sickness over the past couple weeks. Denies any other drug use. She is a current everyday smoker.  Patient is a 37 y.o. female presenting with drug/alcohol assessment. The history is provided by the patient.  Drug / Alcohol Assessment Primary symptoms include no seizures. Associated symptoms include nausea. Pertinent negatives include no vomiting.    Past Medical History  Diagnosis Date  . Hypertension     Past Surgical History  Procedure Date  . Leg surgery     No family history on file.  History  Substance Use Topics  . Smoking status: Current Every Day Smoker  . Smokeless tobacco: Not on file  . Alcohol Use: 4.8 oz/week    8 Cans of beer per week     Comment: beer    OB History    Grav Para Term Preterm Abortions TAB SAB Ect Mult Living                  Review of Systems  Respiratory: Negative for shortness of breath.   Cardiovascular: Negative for chest pain.  Gastrointestinal: Positive for nausea. Negative for vomiting.  Neurological: Negative for  seizures.  Psychiatric/Behavioral: The patient is nervous/anxious.   All other systems reviewed and are negative.    Allergies  Review of patient's allergies indicates no known allergies.  Home Medications  No current outpatient prescriptions on file.  BP 123/75  Pulse 110  Temp 98.6 F (37 C) (Oral)  Resp 20  SpO2 97%  LMP 05/30/2012  Physical Exam  Nursing note and vitals reviewed. Constitutional: She is oriented to person, place, and time. She appears well-developed and well-nourished. No distress.       ETOH odor.  HENT:  Head: Normocephalic and atraumatic.  Mouth/Throat: Oropharynx is clear and moist.  Eyes: Conjunctivae normal and EOM are normal. Pupils are equal, round, and reactive to light.  Neck: Normal range of motion. Neck supple.  Cardiovascular: Regular rhythm and normal heart sounds.  Tachycardia present.   Pulmonary/Chest: Effort normal and breath sounds normal.  Abdominal: Soft. Bowel sounds are normal. There is no tenderness.       Small gravid abdomen  Musculoskeletal: Normal range of motion. She exhibits no edema.  Neurological: She is alert and oriented to person, place, and time.  Skin: Skin is warm and dry.  Psychiatric: She has a normal mood and affect. Her speech is normal and behavior is normal.    ED Course  Procedures (including critical care time)  Labs Reviewed  POCT PREGNANCY, URINE - Abnormal; Notable for the following:  Preg Test, Ur POSITIVE (*)     All other components within normal limits  CBC - Abnormal; Notable for the following:    Hemoglobin 11.4 (*)     HCT 33.1 (*)     All other components within normal limits  ETHANOL - Abnormal; Notable for the following:    Alcohol, Ethyl (B) 46 (*)     All other components within normal limits  COMPREHENSIVE METABOLIC PANEL - Abnormal; Notable for the following:    Creatinine, Ser 0.44 (*)     All other components within normal limits  URINE RAPID DRUG SCREEN (HOSP PERFORMED)    No results found.   No diagnosis found.    MDM  37 y/o female presenting for detox from alcohol. Urine pregnancy positive. Patient was moved to pod C. Discussed patient with Berna Spare from ACT team.        Trevor Mace, PA-C 06/21/12 0010

## 2012-06-21 ENCOUNTER — Inpatient Hospital Stay (HOSPITAL_COMMUNITY)
Admission: AD | Admit: 2012-06-21 | Discharge: 2012-06-26 | DRG: 897 | Disposition: A | Discharge status: Home / Self Care | Payer: Federal, State, Local not specified - Other | Source: Ambulatory Visit | Attending: Psychiatry | Admitting: Psychiatry

## 2012-06-21 ENCOUNTER — Encounter (HOSPITAL_COMMUNITY): Payer: Self-pay | Admitting: *Deleted

## 2012-06-21 DIAGNOSIS — F10239 Alcohol dependence with withdrawal, unspecified: Principal | ICD-10-CM

## 2012-06-21 DIAGNOSIS — F10939 Alcohol use, unspecified with withdrawal, unspecified: Principal | ICD-10-CM

## 2012-06-21 DIAGNOSIS — F1994 Other psychoactive substance use, unspecified with psychoactive substance-induced mood disorder: Secondary | ICD-10-CM

## 2012-06-21 DIAGNOSIS — F1020 Alcohol dependence, uncomplicated: Secondary | ICD-10-CM | POA: Diagnosis present

## 2012-06-21 DIAGNOSIS — Z3490 Encounter for supervision of normal pregnancy, unspecified, unspecified trimester: Secondary | ICD-10-CM

## 2012-06-21 DIAGNOSIS — Z331 Pregnant state, incidental: Secondary | ICD-10-CM

## 2012-06-21 DIAGNOSIS — I10 Essential (primary) hypertension: Secondary | ICD-10-CM | POA: Diagnosis present

## 2012-06-21 MED ORDER — FOLIC ACID 1 MG PO TABS
1.0000 mg | ORAL_TABLET | Freq: Every day | ORAL | Status: DC
  Administered 2012-06-24 – 2012-06-26 (×3): 1 mg via ORAL
  Filled 2012-06-21 (×6): qty 1

## 2012-06-21 MED ORDER — FOLIC ACID 1 MG PO TABS
2.0000 mg | ORAL_TABLET | Freq: Every day | ORAL | Status: AC
  Administered 2012-06-21 – 2012-06-23 (×3): 2 mg via ORAL
  Filled 2012-06-21 (×3): qty 2

## 2012-06-21 MED ORDER — ONDANSETRON 4 MG PO TBDP: 4.0000 mg | ORAL_TABLET | Freq: Four times a day (QID) | ORAL | Status: DC | PRN

## 2012-06-21 MED ORDER — PRENATAL MULTIVITAMIN CH
1.0000 | ORAL_TABLET | Freq: Every day | ORAL | Status: DC
  Administered 2012-06-22 – 2012-06-26 (×5): 1 via ORAL
  Filled 2012-06-21 (×5): qty 1
  Filled 2012-06-21: qty 14
  Filled 2012-06-21 (×3): qty 1

## 2012-06-21 MED ORDER — DIPHENHYDRAMINE HCL 25 MG PO CAPS: 25.0000 mg | ORAL_CAPSULE | Freq: Three times a day (TID) | ORAL | Status: DC | PRN

## 2012-06-21 MED ORDER — LORAZEPAM 1 MG PO TABS: 1.0000 mg | ORAL_TABLET | Freq: Four times a day (QID) | ORAL | Status: DC | PRN

## 2012-06-21 MED ORDER — LORAZEPAM 1 MG PO TABS: 1.0000 mg | ORAL_TABLET | Freq: Three times a day (TID) | ORAL | Status: DC

## 2012-06-21 MED ORDER — LORAZEPAM 1 MG PO TABS: 1.0000 mg | ORAL_TABLET | Freq: Four times a day (QID) | ORAL | Status: DC

## 2012-06-21 MED ORDER — LORAZEPAM 1 MG PO TABS
1.0000 mg | ORAL_TABLET | Freq: Four times a day (QID) | ORAL | Status: DC
  Administered 2012-06-21 (×2): 1 mg via ORAL
  Filled 2012-06-21 (×2): qty 1

## 2012-06-21 MED ORDER — LORAZEPAM 1 MG PO TABS: 1.0000 mg | ORAL_TABLET | Freq: Every day | ORAL | Status: DC

## 2012-06-21 MED ORDER — DIPHENHYDRAMINE HCL 25 MG PO CAPS
50.0000 mg | ORAL_CAPSULE | Freq: Every evening | ORAL | Status: DC | PRN
  Administered 2012-06-21 – 2012-06-25 (×4): 50 mg via ORAL

## 2012-06-21 MED ORDER — VITAMIN B-1 100 MG PO TABS
100.0000 mg | ORAL_TABLET | Freq: Every day | ORAL | Status: DC
  Administered 2012-06-21 – 2012-06-26 (×6): 100 mg via ORAL
  Filled 2012-06-21 (×9): qty 1

## 2012-06-21 MED ORDER — LORAZEPAM 1 MG PO TABS: 1.0000 mg | ORAL_TABLET | Freq: Two times a day (BID) | ORAL | Status: DC

## 2012-06-21 NOTE — [facility] Suicide Risk Assessment (Signed)
Suicide Risk Assessment  Admission Assessment     Nursing information obtained from:  Patient Demographic factors:  Low socioeconomic status Current Mental Status:    Loss Factors:  Financial problems / change in socioeconomic status Historical Factors:    Risk Reduction Factors:  Pregnancy;Responsible for children under 35 years of age;Sense of responsibility to family;Religious beliefs about death  CLINICAL FACTORS:   Alcohol/Substance Abuse/Dependencies  COGNITIVE FEATURES THAT CONTRIBUTE TO RISK: None identified   SUICIDE RISK:   Minimal: No identifiable suicidal ideation.  Patients presenting with no risk factors but with morbid ruminations; may be classified as minimal risk based on the severity of the depressive symptoms  PLAN OF CARE: Detox with Ativan or Librium                              Supportive approach/coping skills                              Relapse prevention                              Be mindful of her pregnant status  I certify that inpatient services furnished can reasonably be expected to improve the patient's condition.  Isami Mehra A 06/21/2012, 3:17 PM

## 2012-06-21 NOTE — ED Notes (Signed)
Pt via security to [facility] with female tech along for the ride,
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
## 2012-06-21 NOTE — Progress Notes (Signed)
[facility] LCSW Group Therapy  06/21/2012 5:23 PM  Type of Therapy:  Group Therapy  Participation Level:  Minimal  Participation Quality:  Drowsy and Resistant  Affect:  Depressed  Cognitive:  Oriented  Insight:  Limited  Engagement in Therapy:  Minimal   Modes of Intervention:  Exploration, Socialization and Support  Summary of Progress/Problems:  New patient came in late to group focused on emotional regulation.  [name] was able to share that alcohol led to her admission and she was really more interested in listening than sharing.  Patient was soon asleep and allowed to remain asleep as she had just been admitted today.    Clide Dales 06/21/2012, 5:23 PM

## 2012-06-21 NOTE — ED Provider Notes (Signed)
Accepted at [facility], Dr Sonia Baller R. Rubin Payor, MD 06/21/12 [phone]
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
## 2012-06-22 ENCOUNTER — Inpatient Hospital Stay (HOSPITAL_COMMUNITY): Payer: Federal, State, Local not specified - Other

## 2012-06-22 ENCOUNTER — Encounter (HOSPITAL_COMMUNITY): Payer: Self-pay | Admitting: *Deleted

## 2012-06-22 DIAGNOSIS — Z3201 Encounter for pregnancy test, result positive: Secondary | ICD-10-CM

## 2012-06-22 DIAGNOSIS — Z3490 Encounter for supervision of normal pregnancy, unspecified, unspecified trimester: Secondary | ICD-10-CM

## 2012-06-22 LAB — WET PREP, GENITAL: Trich, Wet Prep: NONE SEEN

## 2012-06-22 MED ORDER — NICOTINE 21 MG/24HR TD PT24
21.0000 mg | MEDICATED_PATCH | Freq: Every day | TRANSDERMAL | Status: DC
  Administered 2012-06-22 – 2012-06-23 (×2): 21 mg via TRANSDERMAL
  Filled 2012-06-22 (×6): qty 1

## 2012-06-22 MED ORDER — LORAZEPAM 1 MG PO TABS
1.0000 mg | ORAL_TABLET | ORAL | Status: DC | PRN
  Administered 2012-06-22: 1 mg via ORAL
  Filled 2012-06-22: qty 1

## 2012-06-22 NOTE — Progress Notes (Signed)
[facility] LCSW Group Therapy  06/22/2012   Type of Therapy:  Group Therapy  Participation Level:  Minimal  Participation Quality:  Drowsy, could not stay awake  Affect:  Appropriate, patient apologized for inability to be attentive  Cognitive:  Oriented  Insight:  None shared  Engagement in Therapy:  None  Modes of Intervention:  Rapport Building and Support  Clide Dales 06/22/2012

## 2012-06-22 NOTE — Progress Notes (Addendum)
[facility] LCSW Group Therapy  06/22/2012   Type of Therapy:  Discharge Planning  Participation Level:  Did Not Attend  Clide Dales 06/22/2012, 1:01 PM

## 2012-06-22 NOTE — Progress Notes (Signed)
Pt states she is in pt because she is trying to stop drinking

## 2012-06-22 NOTE — Progress Notes (Signed)
Patient ID: [name], female   DOB: [birth year], 37 y.o.   MRN: [id number] She was in beb most of AM, was up to see Dr. And to get her medication. She did go to lunch and has been up. She c/o abdoman pain this and was rubbing her stomach ,Aggie N/P was informed and [facility]  She spoke to a N/P who was going to consult with a Dr. There and call back if necessary.

## 2012-06-22 NOTE — MAU Note (Signed)
Pt states she is here to see how far she is in her pregnancy

## 2012-06-22 NOTE — MAU Provider Note (Signed)
History     CSN: [id number]  Arrival date and time:     First Provider Initiated Contact with Patient 06/22/12 2007      Chief Complaint  Patient presents with  . AICD Problem   HPI [name] is a 37 y.o. G4P2 at [redacted]w[redacted]d who was transferred to MAU from [facility] for confirmation of dating. The patient states that her LMP was early December, but this was not a normal period. Her last normal period was in early November. The patient denies vaginal bleeding, LOF or contractions. She has had an increased amount of white discharge lately and some diffuse abdominal pain, although none today. She has felt a few "flutters" but no fetal movement yet. The patient states that they want confirmation of dating in behavioral health for treatment reasons. She is there because of alcohol addiction.   OB History    Grav Para Term Preterm Abortions TAB SAB Ect Mult Living   4 2              Past Medical History  Diagnosis Date  . Hypertension     Past Surgical History  Procedure Date  . Leg surgery     Family History  Problem Relation Age of Onset  . Hypertension Father   . Cancer Paternal Grandfather     History  Substance Use Topics  . Smoking status: Current Some Day Smoker -- 0.5 packs/day for 15 years    Types: Cigarettes  . Smokeless tobacco: Not on file  . Alcohol Use: 4.8 oz/week    8 Cans of beer per week     Comment: beer    Allergies: No Known Allergies  Prescriptions prior to admission  Medication Sig Dispense Refill  . ibuprofen (ADVIL,MOTRIN) 200 MG tablet Take 200 mg by mouth every 6 (six) hours as needed. For pain/headache        ROS All negative unless otherwise noted in HPI Physical Exam   Blood pressure 114/98, pulse 84, temperature 98.1 F (36.7 C), temperature source Oral, resp. rate 18, height 5\' 7"  (1.702 m), weight 147 lb (66.679 kg), last menstrual period 05/30/2012.  Physical Exam  Constitutional: She is oriented to person,  place, and time. She appears well-developed and well-nourished. No distress.  HENT:  Head: Normocephalic and atraumatic.  Cardiovascular: Normal rate, regular rhythm and normal heart sounds.   Respiratory: Breath sounds normal. No respiratory distress.  GI: Bowel sounds are normal. She exhibits no distension and no mass. There is no tenderness. There is no rebound and no guarding.  Genitourinary: Vagina normal. Uterus is enlarged. Uterus is not tender. Cervix exhibits discharge (thin, white discharge noted at cervical os and in vagina). Cervix exhibits no motion tenderness and no friability. Right adnexum displays no mass and no tenderness. Left adnexum displays no mass and no tenderness.  Neurological: She is alert and oriented to person, place, and time.  Skin: Skin is warm and dry. No erythema.  Psychiatric: She has a normal mood and affect.   Results for orders placed during the hospital encounter of 06/21/12 (from the past 24 hour(s))  WET PREP, GENITAL     Status: Abnormal   Collection Time   06/22/12  8:19 PM      Component Value Range   Yeast Wet Prep HPF POC NONE SEEN  NONE SEEN   Trich, Wet Prep NONE SEEN  NONE SEEN   Clue Cells Wet Prep HPF POC FEW (*) NONE SEEN   WBC,  Wet Prep HPF POC FEW (*) NONE SEEN    MAU Course  Procedures None  Assessment and Plan  2145 - Patient in [place]. Care turned over to [facility], CNM  Freddi Starr, PA-C 06/22/2012, 8:24 PM   US Ob Comp Less 14 Wks  06/22/2012  *RADIOLOGY REPORT*  Clinical Data: Pelvic pain.  OBSTETRIC <14 WK ULTRASOUND  Technique:  Transabdominal ultrasound was performed for evaluation of the gestation as well as the maternal uterus and adnexal regions.  Comparison:  None.  Intrauterine gestational sac: Visualized/normal in shape. Yolk sac: Yes Embryo: Yes Cardiac Activity: Yes Heart Rate: 170 bpm  CRL:  19.2 mm  8 w  4 d       [place] EDC: 01/28/2013  Maternal uterus/Adnexae: No subchorionic hemorrhage is noted.  The  uterus is otherwise unremarkable in appearance.  The right ovary is not visualized.  The left ovary is unremarkable in appearance, measuring 3.1 x 1.2 x 2.1 cm.  No suspicious adnexal masses are seen; there is no evidence of ovarian torsion.  No free fluid is seen in the pelvic cul-de-sac.  IMPRESSION: Single live intrauterine pregnancy noted, with a crown-rump length of 1.9 cm, corresponding to a gestational age of [redacted] weeks 4 days. This matches the gestational age of [redacted] weeks 4 days by LMP, which reflects an estimated date of delivery of February 04, 2013.   Original Report Authenticated By: Tonia Ghent, M.D.    A: IUP [redacted]w[redacted]d by LMP, confirmed by U/S   P: D/C  [facility] to transport pt back to [facility] F/U with early prenatal care at health department as planned Return to MAU as needed   Sharen Counter Certified Nurse-Midwife

## 2012-06-22 NOTE — Progress Notes (Signed)
[facility] MD Progress Note  06/22/2012 3:05 PM [name]  MRN:  [id number]  Subjective: "I have a lot in my mind. I feel stressed. I have craving alcohol last night and this morning too. I drink a bottle of beer in waking up in the morning, that was my routine. I don't know how I'm going to handle it after I get out here. But I have asked the social worker for some king of treatment program after discharging from this hospital. My stomach is not hurting as bad any more".  O: Called the [facility]' hospital, spoke with Dr. Debroah Richardson concerning patient's reports of abdominal pain. [name] is currently pregnant. Estimated gestation 4 weeks? Dr. Debroah Richardson instructed to sent patient to the [facility] hospital for evaluation of pain. And also run some necessary tests to determine age of pregnancy and other necessary information.    Diagnosis:   Axis I: Alcohol dependence, Alcohol withdrawal. Axis II: Deferred Axis III:  Past Medical History  Diagnosis Date  . Hypertension    Axis IV: other psychosocial or environmental problems and Alcoholism Axis V: 41-50 serious symptoms  ADL's:  Intact  Sleep: Fair  Appetite:  Good  Suicidal Ideation:  Plan:  No Intent:  No Means:  No Homicidal Ideation:  Plan:  No Intent:  No Means:  No  AEB (as evidenced by):  Psychiatric Specialty Exam: Review of Systems  Constitutional: Negative.   HENT: Negative.   Eyes: Negative.   Respiratory: Negative.   Cardiovascular: Negative.   Gastrointestinal: Positive for abdominal pain.  Genitourinary: Negative.   Musculoskeletal: Negative.   Skin: Negative.   Neurological: Negative.   Endo/Heme/Allergies: Negative.   Psychiatric/Behavioral: Positive for substance abuse (alcoholism.).    Blood pressure 117/68, pulse 105, temperature 97.8 F (36.6 C), temperature source Oral, resp. rate 16, height 6\' 7"  (2.007 m), weight 66.367 kg (146 lb 5 oz), last menstrual period 05/30/2012.Body mass index is 16.48  kg/(m^2).  General Appearance: Casual  Eye Contact::  Fair  Speech:  Clear and Coherent  Volume:  Normal  Mood:  "I feel stressed".  Affect:  Flat  Thought Process:  Coherent and Intact  Orientation:  Full (Time, Place, and Person)  Thought Content:  Rumination about craving for alcohol  Suicidal Thoughts:  No  Homicidal Thoughts:  No  Memory:  Immediate;   Good Recent;   Good Remote;   Good  Judgement:  Impaired  Insight:  Shallow  Psychomotor Activity:  Anxious  Concentration:  Fair  Recall:  Good  Akathisia:  No  Handed:  Right  AIMS (if indicated):     Assets:  Desire for Improvement  Sleep:  Number of Hours: 5.75    Current Medications: Current Facility-Administered Medications  Medication Dose Route Frequency Provider Last Rate Last Dose  . diphenhydrAMINE (BENADRYL) capsule 25 mg  25 mg Oral TID PRN Rachael Fee, MD       And  . diphenhydrAMINE (BENADRYL) capsule 50 mg  50 mg Oral QHS PRN Rachael Fee, MD   50 mg at 06/21/12 2241  . folic acid (FOLVITE) tablet 2 mg  2 mg Oral Daily Rachael Fee, MD   2 mg at 06/22/12 8295   Followed by  . folic acid (FOLVITE) tablet 1 mg  1 mg Oral Daily Rachael Fee, MD      . LORazepam (ATIVAN) tablet 1 mg  1 mg Oral Q4H PRN Rachael Fee, MD   1 mg at 06/22/12 1113  .  nicotine (NICODERM CQ - dosed in mg/24 hours) patch 21 mg  21 mg Transdermal Q0600 Kerry Hough, PA   21 mg at 06/22/12 0615  . ondansetron (ZOFRAN-ODT) disintegrating tablet 4 mg  4 mg Oral Q6H PRN Rachael Fee, MD      . prenatal multivitamin tablet 1 tablet  1 tablet Oral Daily Rachael Fee, MD   1 tablet at 06/22/12 [phone]  . thiamine (VITAMIN B-1) tablet 100 mg  100 mg Oral Daily Rachael Fee, MD   100 mg at 06/22/12 6295    Lab Results:  Results for orders placed during the hospital encounter of 06/20/12 (from the past 48 hour(s))  POCT PREGNANCY, URINE     Status: Abnormal   Collection Time   06/20/12  6:53 PM      Component Value Range Comment    Preg Test, Ur POSITIVE (*) NEGATIVE   CBC     Status: Abnormal   Collection Time   06/20/12  8:25 PM      Component Value Range Comment   WBC 6.0  4.0 - 10.5 K/uL    RBC 3.87  3.87 - 5.11 MIL/uL    Hemoglobin 11.4 (*) 12.0 - 15.0 g/dL    HCT 28.4 (*) 13.2 - 46.0 %    MCV 85.5  78.0 - 100.0 fL    MCH 29.5  26.0 - 34.0 pg    MCHC 34.4  30.0 - 36.0 g/dL    RDW 44.0  10.2 - 72.5 %    Platelets 249  150 - 400 K/uL   ETHANOL     Status: Abnormal   Collection Time   06/20/12  8:25 PM      Component Value Range Comment   Alcohol, Ethyl (B) 46 (*) 0 - 11 mg/dL   COMPREHENSIVE METABOLIC PANEL     Status: Abnormal   Collection Time   06/20/12  8:25 PM      Component Value Range Comment   Sodium 137  135 - 145 mEq/L    Potassium 4.2  3.5 - 5.1 mEq/L    Chloride 102  96 - 112 mEq/L    CO2 21  19 - 32 mEq/L    Glucose, Bld 73  70 - 99 mg/dL    BUN 6  6 - 23 mg/dL    Creatinine, Ser 3.66 (*) 0.50 - 1.10 mg/dL    Calcium 9.8  8.4 - 44.0 mg/dL    Total Protein 7.7  6.0 - 8.3 g/dL    Albumin 3.7  3.5 - 5.2 g/dL    AST 27  0 - 37 U/L    ALT 12  0 - 35 U/L    Alkaline Phosphatase 45  39 - 117 U/L    Total Bilirubin 0.3  0.3 - 1.2 mg/dL    GFR calc non Af Amer >90  >90 mL/min    GFR calc Af Amer >90  >90 mL/min   URINE RAPID DRUG SCREEN (HOSP PERFORMED)     Status: Normal   Collection Time   06/20/12  9:55 PM      Component Value Range Comment   Opiates NONE DETECTED  NONE DETECTED    Cocaine NONE DETECTED  NONE DETECTED    Benzodiazepines NONE DETECTED  NONE DETECTED    Amphetamines NONE DETECTED  NONE DETECTED    Tetrahydrocannabinol NONE DETECTED  NONE DETECTED    Barbiturates NONE DETECTED  NONE DETECTED     Physical Findings:  AIMS: Facial and Oral Movements Muscles of Facial Expression: None, normal, ,  ,  , Dental Status Current problems with teeth and/or dentures?: No Does patient usually wear dentures?: No  CIWA:  CIWA-Ar Total: 0  COWS:     Treatment Plan Summary: Daily  contact with patient to assess and evaluate symptoms and progress in treatment Medication management  Plan:  Supportive approach/coping skills/relapse prevention. Called the [facility]' hospital, spoke with Dr. Debroah Richardson. Will transfer to the [facility]' hospital for evaluation of abdominal pain per Dr. Olivia Mackie advise. Continue current plan of care.  Medical Decision Making Problem Points:  New problem, with additional work-up planned (4), Review of last therapy session (1) and Review of psycho-social stressors (1) Data Points:  Review of medication regiment & side effects (2)  I certify that inpatient services furnished can reasonably be expected to improve the patient's condition.   Armandina Stammer I 06/22/2012, 3:05 PM

## 2012-06-22 NOTE — Progress Notes (Signed)
Pt is new to the unit this afternoon for detox from ETOH.  Pt just found out at the ED that she is pregnant.   This Clinical research associate spoke with the PA about the Ativan taper ordered for the pt.  Pt is not having any withdrawal symptoms at this time.  It was decided to discontinue the Ativan and re-evaluate meds to help if pt begins to experience moderate withdrawal symptoms.  Pt in agreement with this decision.  Pt denies SI/HI/AV.  Pt makes her needs known to staff.  Safety maintained with q15 minute checks.

## 2012-06-22 NOTE — Progress Notes (Signed)
Pt has returned from evaluation at [facility] hospital. Pt denies any abdominal pain at present time and is not exhibiting any signs or symptoms of withdrawal. Pt was given a snack and vital signs were taken.

## 2012-06-23 MED ORDER — IBUPROFEN 600 MG PO TABS
600.0000 mg | ORAL_TABLET | Freq: Four times a day (QID) | ORAL | Status: DC | PRN
  Administered 2012-06-23 – 2012-06-24 (×3): 600 mg via ORAL
  Filled 2012-06-23 (×3): qty 1

## 2012-06-23 NOTE — Progress Notes (Signed)
[facility] MD Progress Note  06/23/2012 2:27 PM [name]  MRN:  [id number] Subjective:  [name] is having a hard time. She did not sleep last night due to her roommate not being able to sleep due to her own withdrawal. She was seen at the [facility] clinic and a sonogram was done and her pregnancy established. She has not have any overt symptoms of withdrawal that would have required use of benzodiazepines for detox so far. She heard from the baby's father. He wants to be supportive.  Diagnosis:   Axis I: Alcohol Dependence Axis II: Deferred Axis III:  Past Medical History  Diagnosis Date  . Hypertension    Axis IV: problems with primary support group Axis V: 51-60 moderate symptoms  ADL's:  Intact  Sleep: Poor  Appetite:  Fair  Suicidal Ideation:  Plan:  Denies Intent:  Denies Means:  Denies Homicidal Ideation:  Plan:  Denies Intent:  Denies Means:  Denies AEB (as evidenced by):  Psychiatric Specialty Exam: Review of Systems  Constitutional: Negative.   HENT: Negative.   Eyes: Negative.   Respiratory: Negative.   Cardiovascular: Negative.   Gastrointestinal: Negative.   Genitourinary: Negative.   Musculoskeletal: Negative.   Skin: Negative.   Neurological: Negative.   Endo/Heme/Allergies: Negative.   Psychiatric/Behavioral: Positive for substance abuse. The patient is nervous/anxious and has insomnia.     Blood pressure 120/66, pulse 102, temperature 97.6 F (36.4 C), temperature source Oral, resp. rate 18, height 5\' 7"  (1.702 m), weight 66.679 kg (147 lb), last menstrual period 04/30/2012.Body mass index is 23.02 kg/(m^2).  General Appearance: Fairly Groomed  Patent attorney::  Minimal  Speech:  Clear and Coherent, Slow and not spontaneous  Volume:  Decreased  Mood:  Anxious, Depressed and worried  Affect:  Restricted  Thought Process:  Coherent and Goal Directed  Orientation:  Full (Time, Place, and Person)  Thought Content:  worries, concerns  Suicidal Thoughts:  No    Homicidal Thoughts:  No  Memory:  Immediate;   Fair Recent;   Fair Remote;   Fair  Judgement:  Fair  Insight:  Present  Psychomotor Activity:  Decreased  Concentration:  Fair  Recall:  Fair  Akathisia:  No  Handed:  Right  AIMS (if indicated):     Assets:  Desire for Improvement  Sleep:  Number of Hours: 5.5    Current Medications: Current Facility-Administered Medications  Medication Dose Route Frequency Provider Last Rate Last Dose  . diphenhydrAMINE (BENADRYL) capsule 25 mg  25 mg Oral TID PRN Erin Fee, MD       And  . diphenhydrAMINE (BENADRYL) capsule 50 mg  50 mg Oral QHS PRN Erin Fee, MD   50 mg at 06/21/12 2241  . folic acid (FOLVITE) tablet 1 mg  1 mg Oral Daily Erin Fee, MD      . LORazepam (ATIVAN) tablet 1 mg  1 mg Oral Q4H PRN Erin Fee, MD   1 mg at 06/22/12 1113  . nicotine (NICODERM CQ - dosed in mg/24 hours) patch 21 mg  21 mg Transdermal Q0600 Kerry Hough, PA   21 mg at 06/23/12 0641  . ondansetron (ZOFRAN-ODT) disintegrating tablet 4 mg  4 mg Oral Q6H PRN Erin Fee, MD      . prenatal multivitamin tablet 1 tablet  1 tablet Oral Daily Erin Fee, MD   1 tablet at 06/23/12 0756  . thiamine (VITAMIN B-1) tablet 100 mg  100 mg  Oral Daily Erin Fee, MD   100 mg at 06/23/12 1610    Lab Results:  Results for orders placed during the hospital encounter of 06/21/12 (from the past 48 hour(s))  WET PREP, GENITAL     Status: Abnormal   Collection Time   06/22/12  8:19 PM      Component Value Range Comment   Yeast Wet Prep HPF POC NONE SEEN  NONE SEEN    Trich, Wet Prep NONE SEEN  NONE SEEN    Clue Cells Wet Prep HPF POC FEW (*) NONE SEEN    WBC, Wet Prep HPF POC FEW (*) NONE SEEN MODERATE BACTERIA SEEN    Physical Findings: AIMS: Facial and Oral Movements Muscles of Facial Expression: None, normal, ,  ,  , Dental Status Current problems with teeth and/or dentures?: No Does patient usually wear dentures?: No  CIWA:  CIWA-Ar Total:  0  COWS:     Treatment Plan Summary: Daily contact with patient to assess and evaluate symptoms and progress in treatment Medication management Continue to assess for acute withdrawal requiring a benzodiazepine   Plan: Supportive approach/coping skills/relapse prevention Medical Decision Making Problem Points:  Review of psycho-social stressors (1) Data Points:  Review or order medicine tests (1)  I certify that inpatient services furnished can reasonably be expected to improve the patient's condition.   [name] A 06/23/2012, 2:27 PM

## 2012-06-23 NOTE — Progress Notes (Signed)
Patient ID: [name], female   DOB: [birth year], 37 y.o.   MRN: [id number] 06-23-12 @ 1434 nursing shift note: D: this pt is pregnant. She was tired this morning and stayed in bed. A: will encourage and support patient. She was given vitamins at am med time  R: she has been sleeping and has not attended all am groups. rn will monitor and q 15 min cks continue.

## 2012-06-23 NOTE — ED Provider Notes (Signed)
Medical screening examination/treatment/procedure(s) were performed by non-physician practitioner and as supervising physician I was immediately available for consultation/collaboration.  Rakan Soffer, MD 06/23/12 0741 

## 2012-06-23 NOTE — Progress Notes (Signed)
[facility] LCSW Group Therapy  06/23/2012 4:35 PM  Type of Therapy:  Group Therapy  Participation Level:  Active  Participation Quality:  Appropriate  Affect:  Appropriate  Cognitive:  Appropriate  Insight:  Improving  Engagement in Therapy:  Improving  Modes of Intervention:  Clarification, Education, Exploration and Rapport Building  Summary of Progress/Problems:Group topic for today was 'Feelings about Relapse.' Discussion began with books patients like to read. One patient shared the reading from a book she had with her titled "24 Hours a Day" which provides daily meditations for people in recovery. Discussion evolved to how substances effect individuals on a day to day basis.  [name] shared how her "drinking alone, or with best friend, early in the day would lead to more drinking and blowing off what ever I had planned, like job applications, visiting others, just made excuses so I could keep drinking."   [name] 06/23/2012, 4:52 PM

## 2012-06-23 NOTE — Tx Team (Signed)
Interdisciplinary Treatment Plan Update (Adult)  Date: 06/23/2012  Time Reviewed: 9:45 AM   Progress in Treatment: Attending groups: Yes Participating in groups: Yes Taking medication as prescribed:  Yes Tolerating medication:  Yes Family/Significant othe contact made: No Patient understands diagnosis: Yes Discussing patient identified problems/goals with staff: Yes Medical problems stabilized or resolved:  Yes Denies suicidal/homicidal ideation: Yes Issues/concerns per patient self-inventory: No new concerns Other: N/A  New problem(s) identified: None Identified  Reason for Continuation of Hospitalization: Medication stabilization Withdrawal symptoms  Interventions implemented related to continuation of hospitalization: mood stabilization, medication monitoring and adjustment, group therapy and psycho education, suicide risk assessment, collateral contact, aftercare planning, ongoing physician assessments and safety checks q 15 mins  Additional comments: N/A  Estimated length of stay: 3-4 days  Discharge Plan:  CSW is assessing for appropriate referrals.   New goal(s): N/A  Review of initial/current patient goals per problem list:   1. Goal(s): Patient will be able to identify effective and ineffective coping patterns  Met: Not as yet   Target date: 3-4 days  2. Goal (s): Complete Detox Protocol and dentify comprehensive mental wellness and sobriety plan   Met: No   Target date: 06/26/12       Attendees:  Physician:  Geoffery Lyons   06/23/2012 9:45 AM  Nursing:  Isaac Laud, RN  06/23/2012 9:45 AM  Clinical Social Worker Ronda Fairly 06/23/2012 9:45 AM  Other:       06/23/2012 9:45 AM  Other:       06/23/2012 9:45 AM  Other:       06/23/2012 9:45 AM  Other:       06/23/2012 9:45 AM   Scribe for Treatment Team:   Carney Bern, LCSWA  06/23/2012 12:18 PM

## 2012-06-23 NOTE — Progress Notes (Signed)
[facility] LCSW Aftercare Discharge Planning Group Note  06/23/2012   Participation Quality:  Did not attend  Clide Dales 06/23/2012, 4:32 PM
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
## 2012-06-23 NOTE — Progress Notes (Signed)
Patient did not attend the evening karaoke group. Pt was off unit during group at [facility].

## 2012-06-23 NOTE — Progress Notes (Signed)
Patient ID: [name], female   DOB: 17-Jun-1975, 37 y.o.   MRN: [id number] D: No new data from previous assessment. Patient in bed sleeping. Respiration regular and unlabored. No sign of distress noted at this time A: 15 mins checks for  Safety. R: Patient remains asleep. Pt is safe.

## 2012-06-24 MED ORDER — NICOTINE 21 MG/24HR TD PT24
21.0000 mg | MEDICATED_PATCH | Freq: Every day | TRANSDERMAL | Status: DC
  Administered 2012-06-24 – 2012-06-26 (×3): 21 mg via TRANSDERMAL
  Filled 2012-06-24 (×5): qty 1

## 2012-06-24 NOTE — Progress Notes (Signed)
[facility] MD Progress Note  06/24/2012 2:22 PM JACKILYN UMPHLETT  MRN:  [id number] Subjective:  [name] is lying in bed this afternoon complaining of body aches. She feels they may be somewhat related to her withdrawal symptoms. They have been on and off all day today. She endorses some withdrawal symptoms including tremors and nausea as well as headache. She also endorses some cravings for alcohol. She denies anxiety, and rates it as a 1 on a scale of 1-10, where 10 is the worst. She does endorse some depression, and rates it as a 7 on a scale of 1-10. She denies any suicidal or homicidal thoughts. She denies any auditory or visual hallucinations. Her appetite has been good. She complains that her sleep was poor last night.  Diagnosis:   Axis I: Alcohol Dependence Axis II: Deferred Axis III:  Past Medical History  Diagnosis Date  . Hypertension     ADL's:  Intact  Sleep: Poor  Appetite:  Good  Suicidal Ideation:  Patient denies any thought, plan, or intent Homicidal Ideation:  Patient denies any thought, plan, or intent AEB (as evidenced by):  Psychiatric Specialty Exam: Review of Systems  Constitutional: Positive for malaise/fatigue and diaphoresis.  Eyes: Negative.   Respiratory: Negative.   Cardiovascular: Negative.   Gastrointestinal: Positive for nausea.  Genitourinary: Negative.   Musculoskeletal: Positive for myalgias.  Skin: Negative.   Neurological: Positive for tremors and headaches.  Endo/Heme/Allergies: Negative.   Psychiatric/Behavioral: Positive for depression and substance abuse. Negative for suicidal ideas and hallucinations. The patient has insomnia. The patient is not nervous/anxious.     Blood pressure 109/67, pulse 110, temperature 98.3 F (36.8 C), temperature source Oral, resp. rate 18, height 5\' 7"  (1.702 m), weight 66.679 kg (147 lb), last menstrual period 04/30/2012.Body mass index is 23.02 kg/(m^2).  General Appearance: Casual  Eye Contact::  Fair    Speech:  Clear and Coherent  Volume:  Decreased  Mood:  Depressed and Irritable  Affect:  Congruent  Thought Process:  Linear  Orientation:  Full (Time, Place, and Person)  Thought Content:  WDL  Suicidal Thoughts:  No  Homicidal Thoughts:  No  Memory:  Immediate;   Good Recent;   Good Remote;   Good  Judgement:  Impaired  Insight:  Lacking  Psychomotor Activity:  Decreased  Concentration:  Good  Recall:  Good  Akathisia:  No  Handed:  Right  AIMS (if indicated):     Assets:  Communication Skills Desire for Improvement  Sleep:  Number of Hours: 6    Current Medications: Current Facility-Administered Medications  Medication Dose Route Frequency Provider Last Rate Last Dose  . diphenhydrAMINE (BENADRYL) capsule 25 mg  25 mg Oral TID PRN Rachael Fee, MD       And  . diphenhydrAMINE (BENADRYL) capsule 50 mg  50 mg Oral QHS PRN Rachael Fee, MD   50 mg at 06/23/12 2239  . folic acid (FOLVITE) tablet 1 mg  1 mg Oral Daily Rachael Fee, MD   1 mg at 06/24/12 [phone]  . ibuprofen (ADVIL,MOTRIN) tablet 600 mg  600 mg Oral Q6H PRN Nanine Means, NP   600 mg at 06/23/12 2125  . LORazepam (ATIVAN) tablet 1 mg  1 mg Oral Q4H PRN Rachael Fee, MD   1 mg at 06/22/12 1113  . nicotine (NICODERM CQ - dosed in mg/24 hours) patch 21 mg  21 mg Transdermal Q breakfast Rachael Fee, MD   21 mg at  06/24/12 0836  . ondansetron (ZOFRAN-ODT) disintegrating tablet 4 mg  4 mg Oral Q6H PRN Rachael Fee, MD      . prenatal multivitamin tablet 1 tablet  1 tablet Oral Daily Rachael Fee, MD   1 tablet at 06/24/12 [phone]  . thiamine (VITAMIN B-1) tablet 100 mg  100 mg Oral Daily Rachael Fee, MD   100 mg at 06/24/12 9604    Lab Results:  Results for orders placed during the hospital encounter of 06/21/12 (from the past 48 hour(s))  WET PREP, GENITAL     Status: Abnormal   Collection Time   06/22/12  8:19 PM      Component Value Range Comment   Yeast Wet Prep HPF POC NONE SEEN  NONE SEEN    Trich, Wet  Prep NONE SEEN  NONE SEEN    Clue Cells Wet Prep HPF POC FEW (*) NONE SEEN    WBC, Wet Prep HPF POC FEW (*) NONE SEEN MODERATE BACTERIA SEEN    Physical Findings: AIMS: Facial and Oral Movements Muscles of Facial Expression: None, normal, ,  ,  , Dental Status Current problems with teeth and/or dentures?: No Does patient usually wear dentures?: No  CIWA:  CIWA-Ar Total: 2  COWS:     Treatment Plan Summary: Daily contact with patient to assess and evaluate symptoms and progress in treatment Medication management  Plan: We will continue her safe medical withdrawal from alcohol, being mindful of her pregnant status.  Medical Decision Making Problem Points:  Established problem, stable/improving (1) Data Points:  Review of medication regiment & side effects (2)  I certify that inpatient services furnished can reasonably be expected to improve the patient's condition.   Tou Hayner 06/24/2012, 2:22 PM

## 2012-06-24 NOTE — Progress Notes (Signed)
D.  Pt very anxious and upset on approach.  Pacing in hall.  States that she was told when she came in that she would not have a roommate, and now one is scheduled to come to her room.  Pt is experiencing withdrawal symptoms but can not take medication for this due to being pregnant.  Pt has been attending group and interacting appropriately on unit when able, though she has not felt well all day.  She denies SI/HI/hallucinations at this time.  A.  AC notified as well as doctor on call of Pt's roommate situation.  Doctor agreed to allow Pt to have a no roommate order for above reasons.  Support and encouragement offered  R.  Pt feels better with new order, will continue to monitor.

## 2012-06-24 NOTE — Progress Notes (Signed)
[facility] Group Notes:  (Nursing/MHT/Case Management/Adjunct)  Date:  06/24/2012  Time:  6:38 PM  Type of Therapy:  Therapeutic Activity  Participation Level:  Did Not Attend  Dalia Heading 06/24/2012, 6:38 PM

## 2012-06-24 NOTE — Clinical Social Work Psychosocial (Signed)
[facility] Group Notes:  (Clinical Social Work)  06/24/2012  10:00-11:00AM  Summary of Progress/Problems:   The main focus of today's process group was for the patient to identify ways in which they have in the past sabotaged their own recovery and to discuss their motivation to change, as well as confidence that they can change.  Cognitive Behavioral Therapy concepts about the interconnectedness of thoughts/feelings/actions was introduced. The patient expressed that she was choosing not to speak in group, and continued to be silent although CSW did offer chances to participate several times.  She kept her eyes closed, and it could not be assessed whether she was listening or absorbing any information.  Type of Therapy:  Group Therapy - Process   Participation Level:  None  Participation Quality:  Resistant 0 Affect:  Depressed and Flat  Cognitive:  Lacking  Insight:  None  Engagement in Therapy:  None  Modes of Intervention:  Education, Support and Processing, Exploration, Discussion   Ambrose Mantle, LCSW 06/24/2012, 12:09 PM

## 2012-06-24 NOTE — Progress Notes (Signed)
[name] is interacting in the milieu as best she can. She avoids eye contact. She is limited in her insight, as well as her commitment to getting healthier. She rates her depression  And hopelessness " 6/8" , denies SI and says her DC plan is to " stay clean".   A She did not attend her AM group but did attend her Life SKills group and was engaged in the discussion.   R POC cont and safety maintained.

## 2012-06-25 MED ORDER — ACETAMINOPHEN 325 MG PO TABS
650.0000 mg | ORAL_TABLET | Freq: Four times a day (QID) | ORAL | Status: DC | PRN
  Administered 2012-06-25: 650 mg via ORAL

## 2012-06-25 NOTE — Progress Notes (Signed)
D.  Pt pleasant and bright on approach.  States she had some abdominal pain earlier but that it is now gone.  Discussed that it could possibly have been gas related, but asked Pt to let staff know should she have a re-occurrence.  Denies SI/HI/hallucinations at this time.  Positive for evening AA group.  A.  Support and encouragement offered  R.  Will continue to monitor.

## 2012-06-25 NOTE — Progress Notes (Signed)
Psychoeducational Group Note  Date: 06/25/12 Time: 1315  Group Topic/Focus:  Identifying Needs:   The focus of this group is to help patients identify their personal needs that have been historically problematic and identify healthy behaviors to address their needs.  Participation Level:  None  Participation Quality:  Attentive  Affect:  Irritable  Cognitive:  Lacking  Insight:  Limited  Engagement in Group:  Limited  Additional Comments:    1/26/201411:39 AM Kateri Mc Joie Bimler

## 2012-06-25 NOTE — Progress Notes (Signed)
Adult Psychoeducational Group Note  Date:  06/25/2012 Time:  6:49 PM  Group Topic/Focus:  Different perspectives group   Participation Level:  Did Not Attend  Dalia Heading 06/25/2012, 6:49 PM

## 2012-06-25 NOTE — Clinical Social Work Psychosocial (Signed)
[facility] Group Notes:  (Clinical Social Work)  06/25/2012   10-11AM  Summary of Progress/Problems:  The main focus of today's process group was to listen to a variety of genres of music and to identify that different types of music provoke different responses.  The patient then was able to identify personally what was soothing for them, as well as energizing.  Examples were given of how to use this knowledge in sleep habits, with depression, and with other symptoms.  The patient expressed feelings about the music only when directly called on, and most of it made her feel "nothing."  She left the room and returned several times, appeared to be irritated, stated she wants to go home.  Type of Therapy:  Music Therapy with processing done  Participation Level:  None  Participation Quality:  Resistant  Affect:  Irritable  Cognitive:  Oriented  Insight:  Limited  Engagement in Therapy:  Limited  Modes of Intervention:   Socialization, Support and Processing, Exploration, Education, Rapport Building   [employer], LCSW 06/25/2012, 12:17 PM

## 2012-06-25 NOTE — Progress Notes (Signed)
[name] remains quiet, isolative and irritable, stating in group this morning " I just want to go home". She avoids eye contact. SHe complained of lower abd. Pain this afternoon ( along with headache) and PA made aware. She denied uterine cramping, fever, bleeding vaginal discharge and rated her pain  "2".   A She was given 2 tylenol for c/o headache and stated " a little " relief afterwards. SHe edorses minimal insight into her situation and denies SI on her self inventory, rates her depression and hopelessness " 6 /5 " and writes " i am going to another treatment center when I leave here".   R Safety is in place and POC cont.

## 2012-06-25 NOTE — Progress Notes (Signed)
[facility] MD Progress Note  06/25/2012 11:23 AM [name]  MRN:  [id number] Subjective:  [name] reports that she is feeling better today than she was yesterday. She denies any true withdrawal symptoms, but does endorse some cravings. She continues to endorse depression and anxiety, and rates her anxiety as an 11 on a scale of 1-10 where 10 is the worst, and rates her depression as a 7 on a scale of 1-10. She denies any suicidal or homicidal ideation. She denies any auditory or visual hallucinations. She states that her sleep was "all right." She endorses a good appetite, although she did not get up to eat breakfast this morning. She endorses that she will be able to transfer to [facility] recovery Center after discharge for continued substance abuse care.  Diagnosis:   Axis I: Alcohol dependence Axis II: Deferred Axis III:  Past Medical History  Diagnosis Date  . Hypertension     ADL's:  Intact  Sleep: Fair  Appetite:  Good  Suicidal Ideation:  The patient denies any thought, plan, or intent Homicidal Ideation:  The patient denies any thought, plan, or intent AEB (as evidenced by):  Psychiatric Specialty Exam: Review of Systems  Constitutional: Negative.   HENT: Negative.   Eyes: Negative.   Respiratory: Negative.   Cardiovascular: Negative.   Gastrointestinal: Negative.   Genitourinary: Negative.   Musculoskeletal: Positive for myalgias.  Skin: Negative.   Neurological: Negative.   Endo/Heme/Allergies: Negative.   Psychiatric/Behavioral: Positive for depression and substance abuse. Negative for suicidal ideas and hallucinations. The patient is nervous/anxious and has insomnia.     Blood pressure 111/58, pulse 92, temperature 98.7 F (37.1 C), temperature source Oral, resp. rate 18, height 5\' 7"  (1.702 m), weight 66.679 kg (147 lb), last menstrual period 04/30/2012.Body mass index is 23.02 kg/(m^2).  General Appearance: Casual and Fairly Groomed  Patent attorney::  Good    Speech:  Clear and Coherent  Volume:  Normal  Mood:  Euthymic  Affect:  Congruent  Thought Process:  Linear  Orientation:  Full (Time, Place, and Person)  Thought Content:  WDL  Suicidal Thoughts:  No  Homicidal Thoughts:  No  Memory:  Immediate;   Good Recent;   Good Remote;   Good  Judgement:  Fair  Insight:  Fair  Psychomotor Activity:  Normal  Concentration:  Good  Recall:  Good  Akathisia:  No  Handed:  Right  AIMS (if indicated):     Assets:  Communication Skills Desire for Improvement Resilience  Sleep:  Number of Hours: 5.75    Current Medications: Current Facility-Administered Medications  Medication Dose Route Frequency Provider Last Rate Last Dose  . diphenhydrAMINE (BENADRYL) capsule 25 mg  25 mg Oral TID PRN Rachael Fee, MD       And  . diphenhydrAMINE (BENADRYL) capsule 50 mg  50 mg Oral QHS PRN Rachael Fee, MD   50 mg at 06/24/12 2237  . folic acid (FOLVITE) tablet 1 mg  1 mg Oral Daily Rachael Fee, MD   1 mg at 06/25/12 1007  . ibuprofen (ADVIL,MOTRIN) tablet 600 mg  600 mg Oral Q6H PRN Nanine Means, NP   600 mg at 06/24/12 1959  . LORazepam (ATIVAN) tablet 1 mg  1 mg Oral Q4H PRN Rachael Fee, MD   1 mg at 06/22/12 1113  . nicotine (NICODERM CQ - dosed in mg/24 hours) patch 21 mg  21 mg Transdermal Q breakfast Rachael Fee, MD   [phone]  mg at 06/25/12 1008  . ondansetron (ZOFRAN-ODT) disintegrating tablet 4 mg  4 mg Oral Q6H PRN Rachael Fee, MD      . prenatal multivitamin tablet 1 tablet  1 tablet Oral Daily Rachael Fee, MD   1 tablet at 06/25/12 1007  . thiamine (VITAMIN B-1) tablet 100 mg  100 mg Oral Daily Rachael Fee, MD   100 mg at 06/25/12 1008    Lab Results: No results found for this or any previous visit (from the past 48 hour(s)).  Physical Findings: AIMS: Facial and Oral Movements Muscles of Facial Expression: None, normal, ,  ,  , Dental Status Current problems with teeth and/or dentures?: No Does patient usually wear dentures?:  No  CIWA:  CIWA-Ar Total: 9  COWS:     Treatment Plan Summary: Daily contact with patient to assess and evaluate symptoms and progress in treatment Medication management Refer to residential care facility as available  Plan:  Medical Decision Making Problem Points:  Established problem, stable/improving (1) and Review of last therapy session (1) Data Points:  Review of medication regiment & side effects (2)  I certify that inpatient services furnished can reasonably be expected to improve the patient's condition.   Kalle Bernath 06/25/2012, 11:23 AM

## 2012-06-25 NOTE — Progress Notes (Signed)
Adult Psychoeducational Group Note  Date:  06/25/2012 Time:  8:34 PM  Group Topic/Focus: AA Meeting  Participation Level:  Active  Additional Comments:  Patient did attend meeting tonight.  Lachandra Dettmann N 06/25/2012, 8:34 PM 

## 2012-06-26 MED ORDER — PRENATAL MULTIVITAMIN CH: 1.0000 | ORAL_TABLET | Freq: Every day | ORAL | Status: DC

## 2012-06-26 NOTE — Progress Notes (Signed)
[facility] Adult Case Management Discharge Plan :  Will you be returning to the same living situation after discharge: No. At discharge, do you have transportation home?:Yes,   Do you have the ability to pay for your medications:Yes,    Release of information consent forms completed and in the chart;  Patient's signature needed at discharge.  Patient to Follow up at: Follow-up Information    Schedule an appointment as soon as possible for a visit with [facility]-.   Contact information:   [address] [place] [address] [phone]      Follow up with [facility].   Contact information:   [address] [place] [address] [phone]      Follow up with [facility] . On 07/04/2012. (Be at [facility] by 8AM on Tuesday 2/4 with supply of medications and cllothing you will need .  If you start using again before admit date you will not be admitted. )    Contact information:   [address] [place], [place] [address] Ph [phone] Fax [phone]         Patient denies SI/HI:   Yes,  denies both    Safety Planning and Suicide Prevention discussed:  Yes,  during multiple discharge planning groups  Clide Dales 06/26/2012, 12:30 PM

## 2012-06-26 NOTE — Discharge Summary (Signed)
Physician Discharge Summary Note  Patient:  [name] is an 37 y.o., female MRN:  [id number] DOB:  11-Feb-1976 Patient phone:  [phone] (home)  Patient address:   [address] [place] [address],   Date of Admission:  06/21/2012  Date of Discharge: 06/26/12  Reason for Admission:  Alcohol withdrawal symptoms.  Discharge Diagnoses: Active Problems:  Alcohol dependence  Alcohol withdrawal  Pregnancy  Normal IUP (intrauterine pregnancy) on prenatal ultrasound  Review of Systems  Constitutional: Negative.   Eyes: Negative.   Respiratory: Negative.   Cardiovascular: Negative.   Gastrointestinal: Negative.   Genitourinary: Negative.   Musculoskeletal: Negative.   Skin: Negative.   Neurological: Negative.   Endo/Heme/Allergies: Negative.   Psychiatric/Behavioral: Positive for substance abuse. Negative for depression, suicidal ideas, hallucinations and memory loss. The patient is not nervous/anxious and does not have insomnia.    Axis Diagnosis:   AXIS I:  Alcohol dependence AXIS II:  Deferred AXIS III:   Past Medical History  Diagnosis Date  . Hypertension    AXIS IV:  Substance absue issues. AXIS V:  62  Level of Care:  OP  Hospital Course:  Was last here in March 2012. After detox she went to [facility]. Staid around six months. Came to [facility] to see her kids. She was late to see them. The did not allow her to see them. She got frustrated. She Was supposed to pursue outpatient treatment "classes." Says that she could not keep going due to transportation issues. Few months after wards started drinking again. Drinking every day. 7-9 beers (40's) Admits to frustration as she does not have her kids, she is trying to find a job, what is not happening. Stays with a friend who drinks. She just found out she is pregnant. She is planning to have the baby. The father is 55 and she says he is planning to be there for her.  Upon admission into this  hospital, Ms. Peppel was started on Lorazepam 1 mg on prn basis for her alcohol detoxification treatment as it was determined that it is safer than librium in a detoxification treatment for a pregnant female. Ms. Hudock in currently about 6-[redacted] weeks pregnant. She was also enrolled in group counseling sessions and activities to learn coping skills that should help her cope better and manage her substance abuse issues after discharge for a much longer sobriety. She also attended AA/NA meetings being offered and held on this unit. Other than the pregnancy condition, Ms. Fager presented no other health issues that required monitoring and or treatment. She was sent to the [facility] hospital one day last week for evaluation of her pregnancy and to determine the gestation as patient was complaining of non-specific stomach pains.  She received prenatal vitamin supplement to help nourish her pregenancy condition. She was monitored closely for any potential problems that may arise as a result of and or during detoxification treatment. Patient tolerated her treatment regimen and detoxification treatment without any significant adverse effects and or reactions reported.  Patient attended treatment team meeting this am and met with the team. Her symptoms, substance abuse issues, response to treatment and discharge plans discussed. Patient endorsed that she is doing well and stable for discharge to pursue the next phase of her substance abuse treatment.  She was encouraged to join/attend AA/NA meetings being offered and held within her community. She is instructed to get a trusted sponsor from the the advise of others or from whomever within the  AA meetings seems to make sense, and has a proven track record, and will hold her responsible for her sobriety, and both expects and insists on her total abstinence from alcohol. Ms. Alkins was instructed to do the steps honestly with the trusted sponsor.   During this treatment team  meeting, It was agreed upon between patient and the team that she will be discharged to her home that she shares with her boyfriend.  She will continue psychiatric care on outpatient basis at the [facility] medicine for counseling sessions. She also has a follow-up appointment at the [facility] on 07/04/12 @ 08:00 am for substance abuse treatment. For her prenatal care, Ms. Signer is instructed to call the [facility] health Department for an appointment as soon as it is possible start her prenatal care. She was informed of the dangers involved when one is pregnant and abuse any kind of substance.  Upon discharge, patient adamantly denies suicidal, homicidal ideations, auditory, visual hallucinations, delusional thinking and or withdrawal symptoms. Patient left [facility] with all personal belongings in no apparent distress. She received 2 weeks worth samples of her prenatal vitamin supplement discharge medication. Transportation per patient arrangement.   Consults:  gynecology/OB  Significant Diagnostic Studies:  labs: CBC with diff, CMP, UDS, Toxicology tests, Preg. test.  Discharge Vitals:   Blood pressure 114/80, pulse 79, temperature 98.5 F (36.9 C), temperature source Oral, resp. rate 14, height 5\' 7"  (1.702 m), weight 66.679 kg (147 lb), last menstrual period 04/30/2012. Body mass index is 23.02 kg/(m^2). Lab Results:   No results found for this or any previous visit (from the past 72 hour(s)).  Physical Findings: AIMS: Facial and Oral Movements Muscles of Facial Expression: None, normal, ,  ,  , Dental Status Current problems with teeth and/or dentures?: No Does patient usually wear dentures?: No  CIWA:  CIWA-Ar Total: 2  COWS:     Psychiatric Specialty Exam: See Psychiatric Specialty Exam and Suicide Risk Assessment completed by Attending Physician prior to discharge.  Discharge destination:  Home  Is patient on multiple antipsychotic therapies at discharge:  No     Has Patient had three or more failed trials of antipsychotic monotherapy by history:  No  Recommended Plan for Multiple Antipsychotic Therapies: NA     Medication List     As of 06/26/2012  1:06 PM    STOP taking these medications         ibuprofen 200 MG tablet   Commonly known as: ADVIL,MOTRIN      TAKE these medications      Indication    prenatal multivitamin Tabs   Take 1 tablet by mouth daily. For Prenatal Vitamin supplement    Indication: Pregnancy        Follow-up Information    Schedule an appointment as soon as possible for a visit with [facility].   Contact information:   [address] [place] [address] [phone]      Follow up with [facility].   Contact information:   [address] [place] [address] [phone]      Follow up with [facility] . On 07/04/2012. (Be at [facility] by 8AM on Tuesday 2/4 with supply of medications and cllothing you will need .  If you start using again before admit date you will not be admitted. )    Contact information:   [address] [place], [place] [address] Ph  [phone] Fax [phone]         Follow-up recommendations: Activity:  as tolerated Other:  Keep all scheduled follow-up appointments as recommended.     Comments:  Take all your medications as prescribed by your mental healthcare provider. Report any adverse effects and or reactions from your medicines to your outpatient provider promptly. Patient is instructed and cautioned to not engage in alcohol and or illegal drug use while on prescription medicines. In the event of worsening symptoms, patient is instructed to call the crisis hotline, 911 and or go to the nearest ED for appropriate evaluation and treatment of symptoms. Follow-up with your primary care provider for your other medical issues, concerns and or health care needs.     Total  Discharge Time:  Greater than 30 minutes.  SignedArmandina Stammer I 06/26/2012, 1:06 PM

## 2012-06-26 NOTE — Progress Notes (Signed)
[facility] LCSW Group Therapy  06/26/2012 2:17 PM  Type of Therapy:  Group Therapy  Participation Level:  None  Participation Quality:  Inattentive and Resistant  Affect:  Flat  Cognitive:  Appropriate  Insight:  Resistant  Engagement in Therapy:  Resistant  Modes of Intervention:  Activity and Discussion  Summary of Progress/Problems: Sat quietly without interacting, reading, or following the activities.  Left early.  Olivia Mackie 06/26/2012, 2:17 PM

## 2012-06-26 NOTE — Progress Notes (Signed)
Pt  upset tonight, can't sleep because roommate snores very loud.  Roommate normally wears C-Pap but can not tolerate any except her own (goes over her nose).  Pt refused the quiet room.  Pt had previously had a no roommate order but stated that she was convinced during the day to allow a roommate.

## 2012-06-26 NOTE — Progress Notes (Signed)
Adult Psychoeducational Group Note  Date:  06/26/2012 Time:  1:28 PM  Group Topic/Focus:  Wellness Toolbox:   The focus of this group is to discuss various aspects of wellness, balancing those aspects and exploring ways to increase the ability to experience wellness.  Patients will create a wellness toolbox for use upon discharge.  Participation Level:  Did Not Attend  Participation Quality:    Affect:    Cognitive:    Insight:   Engagement in Group:    Modes of Intervention:    Additional Comments:  Pt was with the doctor.  Isla Pence M 06/26/2012, 1:28 PM

## 2012-06-26 NOTE — Progress Notes (Signed)
Adult Psychoeducational Group Note  Date:  06/26/2012 Time:  10:48 AM  Group Topic/Focus:  Healthy Communication:   The focus of this group is to discuss communication, barriers to communication, as well as healthy ways to communicate with others.  Participation Level:  Active  Participation Quality:  Sharing  Affect:  Appropriate  Cognitive:  Oriented  Insight: Good  Engagement in Group:  Engaged  Modes of Intervention:  Discussion and Education  Additional Comments:    Berton Mount T 06/26/2012, 10:48 AM

## 2012-06-26 NOTE — [facility] Suicide Risk Assessment (Signed)
Suicide Risk Assessment  Discharge Assessment     Demographic Factors:  Low socioeconomic status and Unemployed  Mental Status Per Nursing Assessment::   On Admission:     Current Mental Status by Physician: In full contact with reality. There are no suicidal ideas, plans or intent. She is committed to abstinence. She is going to stay with a friend and pursue outpatient treatment  Loss Factors: NA  Historical Factors: NA  Risk Reduction Factors:   Pregnancy and Responsible for children under 13 years of age  Continued Clinical Symptoms:  Alcohol/Substance Abuse/Dependencies  Cognitive Features That Contribute To Risk: Denies   Suicide Risk:  Minimal: No identifiable suicidal ideation.  Patients presenting with no risk factors but with morbid ruminations; may be classified as minimal risk based on the severity of the depressive symptoms  Discharge Diagnoses:   AXIS I:  Alcohol Dependence, S/P Alcohol Withdrawal AXIS II:  Deferred AXIS III:   Past Medical History  Diagnosis Date  . Hypertension    AXIS IV:  problems with primary support group AXIS V:  61-70 mild symptoms  Plan Of Care/Follow-up recommendations:  Activity:  As tolerated Diet:  regular Follow up outpatient basis, as well as with prenatal care Is patient on multiple antipsychotic therapies at discharge:  No   Has Patient had three or more failed trials of antipsychotic monotherapy by history:  No  Recommended Plan for Multiple Antipsychotic Therapies: N/A   Ardis Fullwood A 06/26/2012, 2:28 PM

## 2012-06-26 NOTE — Progress Notes (Signed)
[facility] LCSW Aftercare Discharge Planning Group Note  06/26/2012 12:19 PM  Participation Quality:  Did Not Attend  [name] 06/26/2012, 12:19 PM

## 2012-06-26 NOTE — Progress Notes (Signed)
Pt was discharged home today.  She denied any S/I H/I or A/V hallucinations.    He was given f/u appointment, rx, sample medications, hotline info booklet, bus pass, and letter provided by the case manager.  She voiced understanding to all instructions provided.  She declined the need for smoking cessation materials.  She removed her nicotine patch before she left.

## 2012-06-28 NOTE — Progress Notes (Signed)
Patient Discharge Instructions:  After Visit Summary (AVS):   Faxed to:  06/28/12 Discharge Summary Note:   Faxed to:  06/28/12 Psychiatric Admission Assessment Note:   Faxed to:  06/28/12 Suicide Risk Assessment - Discharge Assessment:   Faxed to:  06/28/12 Faxed/Sent to the Next Level Care provider:  06/28/12 Next Level Care Provider Has Access to the EMR, 06/28/12 Faxed to [facility] @ [phone] Records provided to [facility] via [facility]/Epic Access  Jerelene Redden, 06/28/2012, 4:05 PM

## 2012-07-01 HISTORY — PX: OTHER SURGICAL HISTORY: SHX169

## 2012-07-04 NOTE — Discharge Summary (Signed)
Agree with assessment and plan Shaquille Murdy A. Krystal Delduca, M.D. 

## 2012-07-05 ENCOUNTER — Encounter (HOSPITAL_COMMUNITY): Payer: Self-pay

## 2012-07-05 ENCOUNTER — Inpatient Hospital Stay (HOSPITAL_COMMUNITY)
Admission: AD | Admit: 2012-07-05 | Discharge: 2012-07-05 | Disposition: A | Discharge status: Home / Self Care | Payer: Medicaid Other | Source: Ambulatory Visit | Attending: Obstetrics and Gynecology | Admitting: Obstetrics and Gynecology

## 2012-07-05 DIAGNOSIS — O23599 Infection of other part of genital tract in pregnancy, unspecified trimester: Secondary | ICD-10-CM

## 2012-07-05 DIAGNOSIS — N949 Unspecified condition associated with female genital organs and menstrual cycle: Secondary | ICD-10-CM | POA: Insufficient documentation

## 2012-07-05 DIAGNOSIS — O2390 Unspecified genitourinary tract infection in pregnancy, unspecified trimester: Secondary | ICD-10-CM

## 2012-07-05 DIAGNOSIS — A5901 Trichomonal vulvovaginitis: Secondary | ICD-10-CM

## 2012-07-05 DIAGNOSIS — O98819 Other maternal infectious and parasitic diseases complicating pregnancy, unspecified trimester: Secondary | ICD-10-CM | POA: Insufficient documentation

## 2012-07-05 DIAGNOSIS — R109 Unspecified abdominal pain: Secondary | ICD-10-CM | POA: Insufficient documentation

## 2012-07-05 LAB — WET PREP, GENITAL: Yeast Wet Prep HPF POC: NONE SEEN

## 2012-07-05 MED ORDER — METRONIDAZOLE 500 MG PO TABS
2000.0000 mg | ORAL_TABLET | Freq: Once | ORAL | Status: AC
  Administered 2012-07-05: 2000 mg via ORAL
  Filled 2012-07-05: qty 4

## 2012-07-05 NOTE — MAU Note (Signed)
Is at a maternity home, they want her to get seen.no pain now, had some earlier in upper abd.

## 2012-07-05 NOTE — MAU Provider Note (Signed)
Chief Complaint: No chief complaint on file.   First Provider Initiated Contact with Patient 07/05/12 1057     SUBJECTIVE HPI: [name] is a 37 y.o. G3P2 at [redacted]w[redacted]d by LMP who presents to maternity admissions reporting her maternity home sent her for an evaluation.  She reports some mild abdominal cramping and vaginal discharge that itches.  She has not yet started prenatal care in this pregnancy.  She denies LOF, vaginal bleeding, urinary symptoms, h/a, dizziness, n/v, or fever/chills.     Past Medical History  Diagnosis Date  . Hypertension    Past Surgical History  Procedure Date  . Leg surgery    History   Social History  . Marital Status: Single    Spouse Name: N/A    Number of Children: N/A  . Years of Education: N/A   Occupational History  . Not on file.   Social History Main Topics  . Smoking status: Current Some Day Smoker -- 0.5 packs/day for 15 years    Types: Cigarettes  . Smokeless tobacco: Not on file  . Alcohol Use: 4.8 oz/week    8 Cans of beer per week     Comment: beer  . Drug Use: No  . Sexually Active: Yes    Birth Control/ Protection: None   Other Topics Concern  . Not on file   Social History Narrative  . No narrative on file   No current facility-administered medications on file prior to encounter.   Current Outpatient Prescriptions on File Prior to Encounter  Medication Sig Dispense Refill  . Prenatal Vit-Fe Fumarate-FA (PRENATAL MULTIVITAMIN) TABS Take 1 tablet by mouth daily. For Prenatal Vitamin supplement  30 tablet  0   No Known Allergies  ROS: Pertinent items in HPI  OBJECTIVE Blood pressure 87/55, pulse 94, temperature 98 F (36.7 C), temperature source Oral, resp. rate 18, height 5\' 5"  (1.651 m), weight 67.132 kg (148 lb), last menstrual period 04/30/2012. GENERAL: Well-developed, well-nourished female in no acute distress.  HEENT: Normocephalic HEART: normal rate RESP: normal effort ABDOMEN: Soft,  non-tender EXTREMITIES: Nontender, no edema NEURO: Alert and oriented Pelvic exam: Cervix pink, visually closed, without lesion, large amount thick yellow discharge with odor, vaginal walls and external genitalia normal Bimanual exam: Cervix 0/long/high, firm, anterior, neg CMT, uterus nontender, nonenlarged, adnexa without tenderness, enlargement, or mass  LAB RESULTS Results for orders placed during the hospital encounter of 07/05/12 (from the past 24 hour(s))  WET PREP, GENITAL     Status: Abnormal   Collection Time   07/05/12 11:30 AM      Component Value Range   Yeast Wet Prep HPF POC NONE SEEN  NONE SEEN   Trich, Wet Prep MANY (*) NONE SEEN   Clue Cells Wet Prep HPF POC FEW (*) NONE SEEN   WBC, Wet Prep HPF POC MANY (*) NONE SEEN    ASSESSMENT 1. Trichomonal vaginitis in pregnancy     PLAN  Message sent to clinic for prenatal care  Report to Mindi Junker, NP  Sharen Counter Certified Nurse-Midwife 07/05/2012  11:38 AM

## 2012-07-06 LAB — GC/CHLAMYDIA PROBE AMP: GC Probe RNA: NEGATIVE

## 2012-07-06 NOTE — MAU Provider Note (Signed)
Attestation of Attending Supervision of Advanced Practitioner (CNM/NP): Evaluation and management procedures were performed by the Advanced Practitioner under my supervision and collaboration.  I have reviewed the Advanced Practitioner's note and chart, and I agree with the management and plan.  Quince Santana 07/06/2012 10:18 AM

## 2012-07-12 ENCOUNTER — Emergency Department (HOSPITAL_COMMUNITY)
Admission: EM | Admit: 2012-07-12 | Discharge: 2012-07-13 | Disposition: A | Discharge status: Home / Self Care | Payer: Medicaid Other | Attending: Emergency Medicine | Admitting: Emergency Medicine

## 2012-07-12 ENCOUNTER — Emergency Department (HOSPITAL_COMMUNITY): Payer: Medicaid Other

## 2012-07-12 ENCOUNTER — Emergency Department (HOSPITAL_COMMUNITY)
Admission: EM | Admit: 2012-07-12 | Discharge: 2012-07-12 | Disposition: A | Discharge status: Home / Self Care | Payer: Medicaid Other | Source: Home / Self Care | Attending: Emergency Medicine | Admitting: Emergency Medicine

## 2012-07-12 ENCOUNTER — Encounter (HOSPITAL_COMMUNITY): Payer: Self-pay | Admitting: Emergency Medicine

## 2012-07-12 DIAGNOSIS — O169 Unspecified maternal hypertension, unspecified trimester: Secondary | ICD-10-CM | POA: Insufficient documentation

## 2012-07-12 DIAGNOSIS — O99340 Other mental disorders complicating pregnancy, unspecified trimester: Secondary | ICD-10-CM | POA: Insufficient documentation

## 2012-07-12 DIAGNOSIS — I10 Essential (primary) hypertension: Secondary | ICD-10-CM | POA: Insufficient documentation

## 2012-07-12 DIAGNOSIS — Y929 Unspecified place or not applicable: Secondary | ICD-10-CM | POA: Insufficient documentation

## 2012-07-12 DIAGNOSIS — IMO0001 Reserved for inherently not codable concepts without codable children: Secondary | ICD-10-CM

## 2012-07-12 DIAGNOSIS — F17200 Nicotine dependence, unspecified, uncomplicated: Secondary | ICD-10-CM | POA: Insufficient documentation

## 2012-07-12 DIAGNOSIS — Z4800 Encounter for change or removal of nonsurgical wound dressing: Secondary | ICD-10-CM | POA: Insufficient documentation

## 2012-07-12 DIAGNOSIS — W19XXXA Unspecified fall, initial encounter: Secondary | ICD-10-CM | POA: Insufficient documentation

## 2012-07-12 DIAGNOSIS — O99330 Smoking (tobacco) complicating pregnancy, unspecified trimester: Secondary | ICD-10-CM | POA: Insufficient documentation

## 2012-07-12 DIAGNOSIS — F10929 Alcohol use, unspecified with intoxication, unspecified: Secondary | ICD-10-CM

## 2012-07-12 DIAGNOSIS — S61509A Unspecified open wound of unspecified wrist, initial encounter: Secondary | ICD-10-CM | POA: Insufficient documentation

## 2012-07-12 DIAGNOSIS — F1010 Alcohol abuse, uncomplicated: Secondary | ICD-10-CM | POA: Insufficient documentation

## 2012-07-12 DIAGNOSIS — Y939 Activity, unspecified: Secondary | ICD-10-CM | POA: Insufficient documentation

## 2012-07-12 DIAGNOSIS — Z3490 Encounter for supervision of normal pregnancy, unspecified, unspecified trimester: Secondary | ICD-10-CM

## 2012-07-12 DIAGNOSIS — O99891 Other specified diseases and conditions complicating pregnancy: Secondary | ICD-10-CM | POA: Insufficient documentation

## 2012-07-12 DIAGNOSIS — S61512A Laceration without foreign body of left wrist, initial encounter: Secondary | ICD-10-CM

## 2012-07-12 MED ORDER — ACETAMINOPHEN 325 MG PO TABS
650.0000 mg | ORAL_TABLET | Freq: Once | ORAL | Status: AC
  Administered 2012-07-12: 650 mg via ORAL
  Filled 2012-07-12: qty 2

## 2012-07-12 MED ORDER — TETANUS-DIPHTH-ACELL PERTUSSIS 5-2.5-18.5 LF-MCG/0.5 IM SUSP: 0.5000 mL | Freq: Once | INTRAMUSCULAR | Status: DC

## 2012-07-12 NOTE — ED Provider Notes (Signed)
History     CSN: [id number]  Arrival date & time 07/12/12  1747   First MD Initiated Contact with Patient 07/12/12 1755      Chief Complaint  Patient presents with  . Fall    (Consider location/radiation/quality/duration/timing/severity/associated sxs/prior treatment) Patient is a 37 y.o. female presenting with wound check. The history is provided by the patient.  Wound Check This is a new problem. The current episode started today. Associated symptoms comments: The patient returns to ED after being seen earlier this evening after fall causing laceration to left wrist. She reports her wrist is hurting which is what prompted her return visit. She has not taken any Tylenol at home. She admits to alcohol intoxication. No repeat fall, or new injury since previous ED visit today..    Past Medical History  Diagnosis Date  . Hypertension     Past Surgical History  Procedure Laterality Date  . Leg surgery      Family History  Problem Relation Age of Onset  . Hypertension Father   . Cancer Paternal Grandfather     History  Substance Use Topics  . Smoking status: Current Some Day Smoker -- 0.50 packs/day for 15 years    Types: Cigarettes  . Smokeless tobacco: Not on file  . Alcohol Use: 4.8 oz/week    8 Cans of beer per week     Comment: beer    OB History   Grav Para Term Preterm Abortions TAB SAB Ect Mult Living   3 2        2       Review of Systems  Musculoskeletal:       See HPI.  Skin:       See HPI.    Allergies  Review of patient's allergies indicates no known allergies.  Home Medications   Current Outpatient Rx  Name  Route  Sig  Dispense  Refill  . Prenatal Vit-Fe Fumarate-FA (PRENATAL MULTIVITAMIN) TABS   Oral   Take 1 tablet by mouth daily. For Prenatal Vitamin supplement   30 tablet   0     BP 126/86  Pulse 119  Temp(Src) 98.1 F (36.7 C) (Oral)  Resp 20  SpO2 100%  LMP 04/30/2012  Physical Exam  Constitutional: She appears  well-developed and well-nourished.  Acutely intoxicated.  Musculoskeletal:  Sutures to left wrist as intact. No bleeding. No swelling. FROM all digits distal to wound on wrist.     ED Course  Procedures (including critical care time)  Labs Reviewed - No data to display No results found.   No diagnosis found.  1. Wound check.   MDM  The patient was told she could take Tylenol as she was 3 months pregnant and that this would be the only safe medication. She then became biligerent, starting yelling at staff and left the room. She was escorted to the door by security but reports she did want further evaluation for the pain and became quiet and cooperative. Wrist was x-rayed - no FB, no fracture. Wound dressed. Stable for discharge.        Arnoldo Hooker, PA-C 07/18/12 [phone]

## 2012-07-12 NOTE — ED Notes (Signed)
Patient transported to X-ray 

## 2012-07-12 NOTE — ED Notes (Signed)
Pt presents to ED via [facility] for evaluation of a laceration to anterior aspect of pt forearm, EMS applied dressing and bleeding controlled.  Pt found to be sinus tach 120bpm for EMS, 109 upon arrival to ED, pt about [redacted] weeks pregnant fetal heart tones 133bmp at present.  Pt smells of ETOH, admits to having one beer today.  Placed on continuous cardiac monitor.

## 2012-07-12 NOTE — ED Provider Notes (Signed)
History     CSN: [id number]  Arrival date & time 07/12/12  1747   First MD Initiated Contact with Patient 07/12/12 1755      Chief Complaint  Patient presents with  . Fall  . Laceration    (Consider location/radiation/quality/duration/timing/severity/associated sxs/prior treatment) Patient is a 37 y.o. female presenting with fall and skin laceration. The history is provided by the patient.  Fall The accident occurred 1 to 2 hours ago. The fall occurred while walking. Pertinent negatives include no abdominal pain and no headaches. Associated symptoms comments: She fell while walking on snow-covered sidewalk and missed the curb. She fell causing laceration to left wrist. She denies head injury, chest, or other extremity injury. She is currently approximately 3 months pregnant but denies abdominal pain or injury. .  Laceration   Past Medical History  Diagnosis Date  . Hypertension     Past Surgical History  Procedure Laterality Date  . Leg surgery      Family History  Problem Relation Age of Onset  . Hypertension Father   . Cancer Paternal Grandfather     History  Substance Use Topics  . Smoking status: Current Some Day Smoker -- 0.50 packs/day for 15 years    Types: Cigarettes  . Smokeless tobacco: Not on file  . Alcohol Use: 4.8 oz/week    8 Cans of beer per week     Comment: beer    OB History   Grav Para Term Preterm Abortions TAB SAB Ect Mult Living   3 2        2       Review of Systems  HENT: Negative for neck pain.   Cardiovascular: Negative for chest pain.  Gastrointestinal: Negative for abdominal pain.  Genitourinary: Negative for vaginal bleeding.  Skin: Positive for wound.  Neurological: Negative for syncope and headaches.  Psychiatric/Behavioral: Negative for confusion.    Allergies  Review of patient's allergies indicates no known allergies.  Home Medications   Current Outpatient Rx  Name  Route  Sig  Dispense  Refill  . Prenatal Vit-Fe  Fumarate-FA (PRENATAL MULTIVITAMIN) TABS   Oral   Take 1 tablet by mouth daily. For Prenatal Vitamin supplement   30 tablet   0     BP 126/86  Pulse 119  Temp(Src) 98.1 F (36.7 C) (Oral)  Resp 20  SpO2 100%  LMP 04/30/2012  Physical Exam  Constitutional: She is oriented to person, place, and time. She appears well-developed and well-nourished.  She is acutely intoxicated.  HENT:  Head: Atraumatic.  Eyes: Pupils are equal, round, and reactive to light.  Neck: Normal range of motion.  Pulmonary/Chest: Effort normal.  Abdominal: Soft. Bowel sounds are normal. There is no tenderness.  Abdomen atraumatic in appearance.   Musculoskeletal: Normal range of motion. She exhibits no edema.  No cervical or spinal tenderness. FROM all extremities. 5 cm laceration volar left wrist with exposed tendon. She is able to flex and extend all digits as well as wrist without strength deficit. There is no swelling.   Neurological: She is alert and oriented to person, place, and time.  Neurosensory exam distal to wound on left wrist is intact.  Skin: Skin is warm and dry.  Laceration to left wrist as per musculoskeletal exam.     ED Course  Procedures (including critical care time)  Labs Reviewed - No data to display No results found.  LACERATION REPAIR Performed by: Elpidio Anis A Authorized by: Elpidio Anis A Consent: Verbal  consent obtained. Risks and benefits: risks, benefits and alternatives were discussed Consent given by: patient Patient identity confirmed: provided demographic data Prepped and Draped in normal sterile fashion Wound explored  Laceration Location: left wrist, volar surface   Laceration Length: 5cm  No Foreign Bodies seen or palpated  Anesthesia: local infiltration  Local anesthetic: lidocaine 1% w/o epinephrine  Anesthetic total: 2 ml  Irrigation method: syringe Amount of cleaning: standard  Skin closure: 4-0 prolene  Number of sutures:  9  Technique: simple interrupted   Patient tolerance: Patient tolerated the procedure well with no immediate complications.  No diagnosis found.  1. Laceration left wrist 2. Alcohol intoxication 3. Pregnant with abdominal injury  MDM  FHT's 133  Old chart reviewed. The patient has a history of alcohol dependence and denies wanting help currently. No SI/HI, hallucinations. She is planning on going to the health department for her prenatal care. Normal fetal heart tones since fall and no complaint of abdominal pain - doubt injury.          Arnoldo Hooker, PA-C 07/12/12 1903  Arnoldo Hooker, PA-C 07/12/12 1904

## 2012-07-12 NOTE — ED Notes (Signed)
Pt back from x-ray.

## 2012-07-12 NOTE — ED Notes (Signed)
Pt became upset and began yelling saying "noone is taking care of me, I need a ride home".  RN and PA into assess pt prior.  Pt walked out of room and out of ED at 2311 AMA.  Returned to ED tearful at 2318, returned to room, cooperative at present, security notified.

## 2012-07-12 NOTE — ED Provider Notes (Signed)
History     CSN: [id number]  Arrival date & time 07/12/12  2237   First MD Initiated Contact with Patient 07/12/12 2249      Chief Complaint  Patient presents with  . Laceration    (Consider location/radiation/quality/duration/timing/severity/associated sxs/prior treatment) Patient is a 37 y.o. female presenting with wrist pain. The history is provided by the patient.  Wrist Pain This is a new problem. The current episode started today. Pertinent negatives include no fever or numbness. Associated symptoms comments: She was seen earlier after a fall causing wrist laceration on left. She returns with increased pain and continued bleeding at the sutured wound site. . She has tried nothing for the symptoms.    Past Medical History  Diagnosis Date  . Hypertension     Past Surgical History  Procedure Laterality Date  . Leg surgery      Family History  Problem Relation Age of Onset  . Hypertension Father   . Cancer Paternal Grandfather     History  Substance Use Topics  . Smoking status: Current Some Day Smoker -- 0.50 packs/day for 15 years    Types: Cigarettes  . Smokeless tobacco: Not on file  . Alcohol Use: 4.8 oz/week    8 Cans of beer per week     Comment: beer    OB History   Grav Para Term Preterm Abortions TAB SAB Ect Mult Living   3 2        2       Review of Systems  Constitutional: Negative for fever.  Musculoskeletal:       See HPI.  Skin:       See HPI.  Neurological: Negative.  Negative for numbness.    Allergies  Review of patient's allergies indicates no known allergies.  Home Medications   Current Outpatient Rx  Name  Route  Sig  Dispense  Refill  . Prenatal Vit-Fe Fumarate-FA (PRENATAL MULTIVITAMIN) TABS   Oral   Take 1 tablet by mouth daily. For Prenatal Vitamin supplement   30 tablet   0     BP 118/74  Pulse 107  Temp(Src) 98.5 F (36.9 C) (Oral)  SpO2 100%  LMP 04/30/2012  Physical Exam  Constitutional: She appears  well-developed and well-nourished. No distress.  Acutely intoxicated.  Musculoskeletal:  Sutures intact to volar left wrist laceration. No active bleeding. No swelling or bony deformity.   Skin: Skin is warm and dry.    ED Course  Procedures (including critical care time)  Labs Reviewed - No data to display No results found.   No diagnosis found.  1. Wound recheck   MDM  The laceration was seen and repaired by myself earlier tonight. No interruption of fascia. Tendons visualized at that time and found to be completely intact without full or partial rupture. No palpated foreign body. Suture repair was uncomplicated. The patient became argumentative on discharge, uncooperative with staff, yelling and screaming. She left prior to discharge complete.  X-ray ordered on return to ED with complaint of pain, however, the patient again became irate at not being given anything for pain other than Tylenol due to her pregnant condition. She left AMA, yelling at staff.  Patient returns to room and is cooperative with x-ray. Negative study. Patient discharged home.         Arnoldo Hooker, PA-C 07/12/12 2309  Arnoldo Hooker, PA-C 07/12/12 2358

## 2012-07-12 NOTE — ED Notes (Signed)
Pt became angry upon discharge, refused Tetanus shot, refused to review paperwork.  [employer] at bedside.  Pt escorted to lobby per own request.

## 2012-07-12 NOTE — ED Notes (Signed)
Pt brought to ED via [facility] - was seen at [facility] earlier today and had sutures placed to anterior aspect of left forearm due to laceration-Pt is 3 months pregnant.  Called EMS for increased pain and bleeding.  Upon arrival to ED bleeding noted to site, dressing applied.  Pt states she needs something for pain and she can't feel her fingers- able to move all fingers.   ETOH on board.

## 2012-07-13 NOTE — ED Notes (Signed)
Pt refused to go over discharge instructions, became angry and began yelling.  Pt began roaming the hallways saying she was looking for a way out.  [employer] escorted pt to lobby, pt walked out of ED door.

## 2012-07-13 NOTE — ED Provider Notes (Signed)
Medical screening examination/treatment/procedure(s) were performed by non-physician practitioner and as supervising physician I was immediately available for consultation/collaboration.   Carleene Cooper III, MD 07/13/12 1110

## 2012-07-13 NOTE — ED Provider Notes (Signed)
Medical screening examination/treatment/procedure(s) were performed by non-physician practitioner and as supervising physician I was immediately available for consultation/collaboration.  Leighanne Adolph Lytle Michaels, MD 07/13/12 [phone]

## 2012-07-17 ENCOUNTER — Encounter: Payer: Self-pay | Admitting: Advanced Practice Midwife

## 2012-07-18 NOTE — ED Provider Notes (Signed)
Medical screening examination/treatment/procedure(s) were performed by non-physician practitioner and as supervising physician I was immediately available for consultation/collaboration.   Carleene Cooper III, MD 07/18/12 [phone]

## 2012-08-14 DIAGNOSIS — O99330 Smoking (tobacco) complicating pregnancy, unspecified trimester: Secondary | ICD-10-CM | POA: Insufficient documentation

## 2012-08-14 DIAGNOSIS — I10 Essential (primary) hypertension: Secondary | ICD-10-CM | POA: Insufficient documentation

## 2012-08-14 DIAGNOSIS — R109 Unspecified abdominal pain: Secondary | ICD-10-CM | POA: Insufficient documentation

## 2012-08-14 DIAGNOSIS — O9989 Other specified diseases and conditions complicating pregnancy, childbirth and the puerperium: Secondary | ICD-10-CM | POA: Insufficient documentation

## 2012-08-14 LAB — COMPREHENSIVE METABOLIC PANEL
AST: 20 U/L (ref 0–37)
Albumin: 3.4 g/dL — ABNORMAL LOW (ref 3.5–5.2)
Chloride: 101 mEq/L (ref 96–112)
Creatinine, Ser: 0.42 mg/dL — ABNORMAL LOW (ref 0.50–1.10)
Potassium: 3.5 mEq/L (ref 3.5–5.1)
Sodium: 133 mEq/L — ABNORMAL LOW (ref 135–145)
Total Bilirubin: 0.1 mg/dL — ABNORMAL LOW (ref 0.3–1.2)

## 2012-08-14 LAB — URINALYSIS, ROUTINE W REFLEX MICROSCOPIC
Glucose, UA: NEGATIVE mg/dL
Hgb urine dipstick: NEGATIVE
Protein, ur: NEGATIVE mg/dL

## 2012-08-14 LAB — CBC WITH DIFFERENTIAL/PLATELET
Basophils Absolute: 0 10*3/uL (ref 0.0–0.1)
Basophils Relative: 0 % (ref 0–1)
MCHC: 34.5 g/dL (ref 30.0–36.0)
Monocytes Absolute: 0.4 10*3/uL (ref 0.1–1.0)
Neutro Abs: 4.8 10*3/uL (ref 1.7–7.7)
Neutrophils Relative %: 67 % (ref 43–77)
Platelets: 246 10*3/uL (ref 150–400)
RDW: 13.8 % (ref 11.5–15.5)

## 2012-08-14 LAB — URINE MICROSCOPIC-ADD ON

## 2012-08-14 NOTE — ED Notes (Addendum)
Pt states that she is 4 months pregnant. Pt states that she is having back pain and abdominal pain for a week. Pt denies vaginal bleeding or discharge. Pt states that she has kidney problems and wants them checked out as well. (pt has not been officially diagnosed but states she urinates a lot) pt has ETOH on board with slurred speech and unable to walk straight.

## 2012-08-15 ENCOUNTER — Emergency Department (HOSPITAL_COMMUNITY)
Admission: EM | Admit: 2012-08-15 | Discharge: 2012-08-15 | Discharge status: Against Medical Advice | Payer: Medicaid Other | Attending: Emergency Medicine | Admitting: Emergency Medicine

## 2012-08-15 NOTE — ED Notes (Signed)
Pt called from waiting room with no answer 

## 2012-08-21 ENCOUNTER — Other Ambulatory Visit: Payer: Self-pay | Admitting: Obstetrics and Gynecology

## 2012-08-21 ENCOUNTER — Other Ambulatory Visit (HOSPITAL_COMMUNITY)
Admission: RE | Admit: 2012-08-21 | Discharge: 2012-08-21 | Disposition: A | Discharge status: Home / Self Care | Payer: Medicaid Other | Source: Ambulatory Visit | Attending: Obstetrics and Gynecology | Admitting: Obstetrics and Gynecology

## 2012-08-21 ENCOUNTER — Ambulatory Visit (INDEPENDENT_AMBULATORY_CARE_PROVIDER_SITE_OTHER): Payer: Medicaid Other | Admitting: Advanced Practice Midwife

## 2012-08-21 ENCOUNTER — Encounter: Payer: Self-pay | Admitting: Advanced Practice Midwife

## 2012-08-21 VITALS — BP 129/77 | Temp 97.8°F | Ht 66.0 in | Wt 156.0 lb

## 2012-08-21 DIAGNOSIS — N760 Acute vaginitis: Secondary | ICD-10-CM | POA: Insufficient documentation

## 2012-08-21 DIAGNOSIS — O99332 Smoking (tobacco) complicating pregnancy, second trimester: Secondary | ICD-10-CM

## 2012-08-21 DIAGNOSIS — Z1151 Encounter for screening for human papillomavirus (HPV): Secondary | ICD-10-CM | POA: Insufficient documentation

## 2012-08-21 DIAGNOSIS — O99310 Alcohol use complicating pregnancy, unspecified trimester: Secondary | ICD-10-CM | POA: Insufficient documentation

## 2012-08-21 DIAGNOSIS — Z3480 Encounter for supervision of other normal pregnancy, unspecified trimester: Secondary | ICD-10-CM | POA: Insufficient documentation

## 2012-08-21 DIAGNOSIS — O99312 Alcohol use complicating pregnancy, second trimester: Secondary | ICD-10-CM

## 2012-08-21 DIAGNOSIS — O09529 Supervision of elderly multigravida, unspecified trimester: Secondary | ICD-10-CM | POA: Insufficient documentation

## 2012-08-21 DIAGNOSIS — Z01419 Encounter for gynecological examination (general) (routine) without abnormal findings: Secondary | ICD-10-CM | POA: Insufficient documentation

## 2012-08-21 DIAGNOSIS — O09522 Supervision of elderly multigravida, second trimester: Secondary | ICD-10-CM

## 2012-08-21 DIAGNOSIS — Z113 Encounter for screening for infections with a predominantly sexual mode of transmission: Secondary | ICD-10-CM | POA: Insufficient documentation

## 2012-08-21 DIAGNOSIS — O99320 Drug use complicating pregnancy, unspecified trimester: Secondary | ICD-10-CM

## 2012-08-21 DIAGNOSIS — Z3482 Encounter for supervision of other normal pregnancy, second trimester: Secondary | ICD-10-CM

## 2012-08-21 DIAGNOSIS — O99330 Smoking (tobacco) complicating pregnancy, unspecified trimester: Secondary | ICD-10-CM

## 2012-08-21 DIAGNOSIS — R87810 Cervical high risk human papillomavirus (HPV) DNA test positive: Secondary | ICD-10-CM | POA: Insufficient documentation

## 2012-08-21 LAB — POCT URINALYSIS DIP (DEVICE)
Bilirubin Urine: NEGATIVE
Glucose, UA: NEGATIVE mg/dL
Leukocytes, UA: NEGATIVE
Nitrite: NEGATIVE

## 2012-08-21 MED ORDER — METRONIDAZOLE 500 MG PO TABS: 2000.0000 mg | ORAL_TABLET | Freq: Once | ORAL | Status: DC

## 2012-08-21 MED ORDER — PRENATAL MULTIVITAMIN CH: 1.0000 | ORAL_TABLET | Freq: Every day | ORAL | Status: DC

## 2012-08-21 NOTE — Patient Instructions (Signed)
Pregnancy - Second Trimester The second trimester of pregnancy (3 to 6 months) is a period of rapid growth for you and your baby. At the end of the sixth month, your baby is about 9 inches long and weighs 1 1/2 pounds. You will begin to feel the baby move between 18 and 20 weeks of the pregnancy. This is called quickening. Weight gain is faster. A clear fluid (colostrum) may leak out of your breasts. You may feel small contractions of the womb (uterus). This is known as false labor or Braxton-Hicks contractions. This is like a practice for labor when the baby is ready to be born. Usually, the problems with morning sickness have usually passed by the end of your first trimester. Some women develop small dark blotches (called cholasma, mask of pregnancy) on their face that usually goes away after the baby is born. Exposure to the sun makes the blotches worse. Acne may also develop in some pregnant women and pregnant women who have acne, may find that it goes away. PRENATAL EXAMS  Blood work may continue to be done during prenatal exams. These tests are done to check on your health and the probable health of your baby. Blood work is used to follow your blood levels (hemoglobin). Anemia (low hemoglobin) is common during pregnancy. Iron and vitamins are given to help prevent this. You will also be checked for diabetes between 24 and 28 weeks of the pregnancy. Some of the previous blood tests may be repeated.  The size of the uterus is measured during each visit. This is to make sure that the baby is continuing to grow properly according to the dates of the pregnancy.  Your blood pressure is checked every prenatal visit. This is to make sure you are not getting toxemia.  Your urine is checked to make sure you do not have an infection, diabetes or protein in the urine.  Your weight is checked often to make sure gains are happening at the suggested rate. This is to ensure that both you and your baby are growing  normally.  Sometimes, an ultrasound is performed to confirm the proper growth and development of the baby. This is a test which bounces harmless sound waves off the baby so your caregiver can more accurately determine due dates. Sometimes, a specialized test is done on the amniotic fluid surrounding the baby. This test is called an amniocentesis. The amniotic fluid is obtained by sticking a needle into the belly (abdomen). This is done to check the chromosomes in instances where there is a concern about possible genetic problems with the baby. It is also sometimes done near the end of pregnancy if an early delivery is required. In this case, it is done to help make sure the baby's lungs are mature enough for the baby to live outside of the womb. CHANGES OCCURING IN THE SECOND TRIMESTER OF PREGNANCY Your body goes through many changes during pregnancy. They vary from person to person. Talk to your caregiver about changes you notice that you are concerned about.  During the second trimester, you will likely have an increase in your appetite. It is normal to have cravings for certain foods. This varies from person to person and pregnancy to pregnancy.  Your lower abdomen will begin to bulge.  You may have to urinate more often because the uterus and baby are pressing on your bladder. It is also common to get more bladder infections during pregnancy (pain with urination). You can help this by   drinking lots of fluids and emptying your bladder before and after intercourse.  You may begin to get stretch marks on your hips, abdomen, and breasts. These are normal changes in the body during pregnancy. There are no exercises or medications to take that prevent this change.  You may begin to develop swollen and bulging veins (varicose veins) in your legs. Wearing support hose, elevating your feet for 15 minutes, 3 to 4 times a day and limiting salt in your diet helps lessen the problem.  Heartburn may develop  as the uterus grows and pushes up against the stomach. Antacids recommended by your caregiver helps with this problem. Also, eating smaller meals 4 to 5 times a day helps.  Constipation can be treated with a stool softener or adding bulk to your diet. Drinking lots of fluids, vegetables, fruits, and whole grains are helpful.  Exercising is also helpful. If you have been very active up until your pregnancy, most of these activities can be continued during your pregnancy. If you have been less active, it is helpful to start an exercise program such as walking.  Hemorrhoids (varicose veins in the rectum) may develop at the end of the second trimester. Warm sitz baths and hemorrhoid cream recommended by your caregiver helps hemorrhoid problems.  Backaches may develop during this time of your pregnancy. Avoid heavy lifting, wear low heal shoes and practice good posture to help with backache problems.  Some pregnant women develop tingling and numbness of their hand and fingers because of swelling and tightening of ligaments in the wrist (carpel tunnel syndrome). This goes away after the baby is born.  As your breasts enlarge, you may have to get a bigger bra. Get a comfortable, cotton, support bra. Do not get a nursing bra until the last month of the pregnancy if you will be nursing the baby.  You may get a dark line from your belly button to the pubic area called the linea nigra.  You may develop rosy cheeks because of increase blood flow to the face.  You may develop spider looking lines of the face, neck, arms and chest. These go away after the baby is born. HOME CARE INSTRUCTIONS   It is extremely important to avoid all smoking, herbs, alcohol, and unprescribed drugs during your pregnancy. These chemicals affect the formation and growth of the baby. Avoid these chemicals throughout the pregnancy to ensure the delivery of a healthy infant.  Most of your home care instructions are the same as  suggested for the first trimester of your pregnancy. Keep your caregiver's appointments. Follow your caregiver's instructions regarding medication use, exercise and diet.  During pregnancy, you are providing food for you and your baby. Continue to eat regular, well-balanced meals. Choose foods such as meat, fish, milk and other low fat dairy products, vegetables, fruits, and whole-grain breads and cereals. Your caregiver will tell you of the ideal weight gain.  A physical sexual relationship may be continued up until near the end of pregnancy if there are no other problems. Problems could include early (premature) leaking of amniotic fluid from the membranes, vaginal bleeding, abdominal pain, or other medical or pregnancy problems.  Exercise regularly if there are no restrictions. Check with your caregiver if you are unsure of the safety of some of your exercises. The greatest weight gain will occur in the last 2 trimesters of pregnancy. Exercise will help you:  Control your weight.  Get you in shape for labor and delivery.  Lose weight   after you have the baby.  Wear a good support or jogging bra for breast tenderness during pregnancy. This may help if worn during sleep. Pads or tissues may be used in the bra if you are leaking colostrum.  Do not use hot tubs, steam rooms or saunas throughout the pregnancy.  Wear your seat belt at all times when driving. This protects you and your baby if you are in an accident.  Avoid raw meat, uncooked cheese, cat litter boxes and soil used by cats. These carry germs that can cause birth defects in the baby.  The second trimester is also a good time to visit your dentist for your dental health if this has not been done yet. Getting your teeth cleaned is OK. Use a soft toothbrush. Brush gently during pregnancy.  It is easier to loose urine during pregnancy. Tightening up and strengthening the pelvic muscles will help with this problem. Practice stopping your  urination while you are going to the bathroom. These are the same muscles you need to strengthen. It is also the muscles you would use as if you were trying to stop from passing gas. You can practice tightening these muscles up 10 times a set and repeating this about 3 times per day. Once you know what muscles to tighten up, do not perform these exercises during urination. It is more likely to contribute to an infection by backing up the urine.  Ask for help if you have financial, counseling or nutritional needs during pregnancy. Your caregiver will be able to offer counseling for these needs as well as refer you for other special needs.  Your skin may become oily. If so, wash your face with mild soap, use non-greasy moisturizer and oil or cream based makeup. MEDICATIONS AND DRUG USE IN PREGNANCY  Take prenatal vitamins as directed. The vitamin should contain 1 milligram of folic acid. Keep all vitamins out of reach of children. Only a couple vitamins or tablets containing iron may be fatal to a baby or young child when ingested.  Avoid use of all medications, including herbs, over-the-counter medications, not prescribed or suggested by your caregiver. Only take over-the-counter or prescription medicines for pain, discomfort, or fever as directed by your caregiver. Do not use aspirin.  Let your caregiver also know about herbs you may be using.  Alcohol is related to a number of birth defects. This includes fetal alcohol syndrome. All alcohol, in any form, should be avoided completely. Smoking will cause low birth rate and premature babies.  Street or illegal drugs are very harmful to the baby. They are absolutely forbidden. A baby born to an addicted mother will be addicted at birth. The baby will go through the same withdrawal an adult does. SEEK MEDICAL CARE IF:  You have any concerns or worries during your pregnancy. It is better to call with your questions if you feel they cannot wait, rather  than worry about them. SEEK IMMEDIATE MEDICAL CARE IF:   An unexplained oral temperature above 102 F (38.9 C) develops, or as your caregiver suggests.  You have leaking of fluid from the vagina (birth canal). If leaking membranes are suspected, take your temperature and tell your caregiver of this when you call.  There is vaginal spotting, bleeding, or passing clots. Tell your caregiver of the amount and how many pads are used. Light spotting in pregnancy is common, especially following intercourse.  You develop a bad smelling vaginal discharge with a change in the color from clear   to white.  You continue to feel sick to your stomach (nauseated) and have no relief from remedies suggested. You vomit blood or coffee ground-like materials.  You lose more than 2 pounds of weight or gain more than 2 pounds of weight over 1 week, or as suggested by your caregiver.  You notice swelling of your face, hands, feet, or legs.  You get exposed to German measles and have never had them.  You are exposed to fifth disease or chickenpox.  You develop belly (abdominal) pain. Round ligament discomfort is a common non-cancerous (benign) cause of abdominal pain in pregnancy. Your caregiver still must evaluate you.  You develop a bad headache that does not go away.  You develop fever, diarrhea, pain with urination, or shortness of breath.  You develop visual problems, blurry, or double vision.  You fall or are in a car accident or any kind of trauma.  There is mental or physical violence at home. Document Released: 05/11/2001 Document Revised: 08/09/2011 Document Reviewed: 11/13/2008 [employer] Patient Information 2013 [employer].  

## 2012-08-21 NOTE — Progress Notes (Signed)
Nutrition note: 1st visit consult Pt has gained 16# @ 100w1d, which is > expected. Pt reports appetite is up and down but eats 1-2 meals & 2 snacks/d on average. Pt is taking PNV. Pt reports nausea occ but no heartburn. Pt reports smoking 4-5 cigs/d, which is down from 10/d. Pt reports both her & her partner smoke inside the house. Pt received verbal & written education on general nutrition during pregnancy. Encouraged small freq meals/ snacks with protein sources. Disc importance of any amount of BF. Disc importance of stopping smoking & having her and her partner smoke outside. Disc wt gain goals of 25-35# or 1#/wk. Pt agrees to cont PNV & work on decreasing to goal of stopping smoking. Pt does not have WIC but plans to apply. Pt does not plan to BF but is open to at least trying in the hospital. F/u in 4-6 wks Blondell Reveal, MS, RD, LDN
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
## 2012-08-21 NOTE — Progress Notes (Signed)
Pulse 109 C/o left hand pain and right foot pain. States she saw a smear of blood on underwear last week.

## 2012-08-22 LAB — PRENATAL PANEL VII
Basophils Relative: 0 % (ref 0–1)
HIV: NONREACTIVE
Hemoglobin: 9.7 g/dL — ABNORMAL LOW (ref 12.0–15.0)
Hepatitis B Surface Ag: NEGATIVE
Lymphs Abs: 1.2 10*3/uL (ref 0.7–4.0)
Monocytes Relative: 6 % (ref 3–12)
Neutro Abs: 5 10*3/uL (ref 1.7–7.7)
Neutrophils Relative %: 75 % (ref 43–77)
RBC: 3.53 MIL/uL — ABNORMAL LOW (ref 3.87–5.11)
Rubella: 1.22 Index — ABNORMAL HIGH (ref <0.90)

## 2012-08-22 LAB — DRUG SCREEN, URINE
Benzodiazepines.: NEGATIVE
Creatinine,U: 61.19 mg/dL
Methadone: NEGATIVE
Propoxyphene: NEGATIVE

## 2012-08-23 ENCOUNTER — Encounter: Payer: Self-pay | Admitting: *Deleted

## 2012-08-23 LAB — CULTURE, OB URINE: Colony Count: >=100000

## 2012-08-27 ENCOUNTER — Other Ambulatory Visit: Payer: Self-pay | Admitting: Advanced Practice Midwife

## 2012-08-27 MED ORDER — NITROFURANTOIN MONOHYD MACRO 100 MG PO CAPS: 100.0000 mg | ORAL_CAPSULE | Freq: Two times a day (BID) | ORAL | Status: DC

## 2012-08-27 NOTE — Progress Notes (Signed)
Urine culture + e. Coli, rx sent for macrobid 100 mg i po bid x 7 days.

## 2012-08-28 ENCOUNTER — Encounter: Payer: Self-pay | Admitting: *Deleted

## 2012-08-28 ENCOUNTER — Telehealth: Payer: Self-pay | Admitting: *Deleted

## 2012-08-28 NOTE — Telephone Encounter (Signed)
Called [name]- unable to leave a message- heard a message person is unavailable.

## 2012-08-28 NOTE — Telephone Encounter (Signed)
Called home number ; unable to leave message

## 2012-08-28 NOTE — Telephone Encounter (Signed)
Message copied by Gerome Apley on Mon Aug 28, 2012  9:08 AM ------      Message from: Archie Patten      Created: Sun Aug 27, 2012  9:39 AM       Rx sent to pharmacy for  [facility], please inform patient. ------

## 2012-08-29 ENCOUNTER — Emergency Department (HOSPITAL_COMMUNITY): Payer: Medicaid Other

## 2012-08-29 ENCOUNTER — Emergency Department (HOSPITAL_COMMUNITY)
Admission: EM | Admit: 2012-08-29 | Discharge: 2012-08-29 | Disposition: A | Discharge status: Home / Self Care | Payer: Medicaid Other | Attending: Emergency Medicine | Admitting: Emergency Medicine

## 2012-08-29 DIAGNOSIS — M25532 Pain in left wrist: Secondary | ICD-10-CM

## 2012-08-29 DIAGNOSIS — M79671 Pain in right foot: Secondary | ICD-10-CM

## 2012-08-29 DIAGNOSIS — O9989 Other specified diseases and conditions complicating pregnancy, childbirth and the puerperium: Secondary | ICD-10-CM | POA: Insufficient documentation

## 2012-08-29 DIAGNOSIS — M71332 Other bursal cyst, left wrist: Secondary | ICD-10-CM

## 2012-08-29 DIAGNOSIS — M7130 Other bursal cyst, unspecified site: Secondary | ICD-10-CM | POA: Insufficient documentation

## 2012-08-29 DIAGNOSIS — M79609 Pain in unspecified limb: Secondary | ICD-10-CM | POA: Insufficient documentation

## 2012-08-29 DIAGNOSIS — M25539 Pain in unspecified wrist: Secondary | ICD-10-CM | POA: Insufficient documentation

## 2012-08-29 DIAGNOSIS — O99330 Smoking (tobacco) complicating pregnancy, unspecified trimester: Secondary | ICD-10-CM | POA: Insufficient documentation

## 2012-08-29 DIAGNOSIS — Z9889 Other specified postprocedural states: Secondary | ICD-10-CM | POA: Insufficient documentation

## 2012-08-29 DIAGNOSIS — M25439 Effusion, unspecified wrist: Secondary | ICD-10-CM | POA: Insufficient documentation

## 2012-08-29 DIAGNOSIS — Z79899 Other long term (current) drug therapy: Secondary | ICD-10-CM | POA: Insufficient documentation

## 2012-08-29 DIAGNOSIS — R509 Fever, unspecified: Secondary | ICD-10-CM | POA: Insufficient documentation

## 2012-08-29 DIAGNOSIS — M899 Disorder of bone, unspecified: Secondary | ICD-10-CM | POA: Insufficient documentation

## 2012-08-29 DIAGNOSIS — Z9181 History of falling: Secondary | ICD-10-CM | POA: Insufficient documentation

## 2012-08-29 DIAGNOSIS — M259 Joint disorder, unspecified: Secondary | ICD-10-CM | POA: Insufficient documentation

## 2012-08-29 MED ORDER — ACETAMINOPHEN 500 MG PO TABS
1000.0000 mg | ORAL_TABLET | Freq: Once | ORAL | Status: AC
  Administered 2012-08-29: 1000 mg via ORAL
  Filled 2012-08-29: qty 2

## 2012-08-29 NOTE — Telephone Encounter (Addendum)
Called pt and heard message stating that the person called is unavailable- unable to leave message. 4/2  1400- called pt and spoke w/boyfriend. I stated that I was calling with test results. He said, "Oh about the antibiotic?" and then stated that he will be going to the pharmacy today to pick it up. I stated that Ieasha may call us back if she has additional questions.

## 2012-08-29 NOTE — ED Provider Notes (Signed)
History    This chart was scribed for non-physician practitioner Junius Finner, PA-C working with Glynn Octave, MD by Gerlean Ren, ED Scribe. This patient was seen in room TR08C/TR08C and the patient's care was started at 9:32 PM.    CSN: [id number]  Arrival date & time 08/29/12  1859   First MD Initiated Contact with Patient 08/29/12 2131      Chief Complaint  Patient presents with  . Hand Pain  . Foot Pain     The history is provided by the patient. No language interpreter was used.  [name] is a 37 y.o. female who presents to the Emergency Department complaining of 2 months of constant gradually worsening pain and swelling over volar surface of left wrist where pt had sutures placed 2 months ago.  Pt reports sutures came out on their own and were not removed by medical professional because she kept having to wait a long time in the ED.  Pt also c/o ongoing pain over medial surface of right foot at base of big toe.  Both are related to a fall 2 months ago when pt slipped in snow.  Pt reports chills and subjective fevers.  Pt has not used anything for pain due to being pregnant.  Pt's description of the location and quality of her pains are limited due to her frustration and unwillingness to communicate.  No past medical history on file.  Past Surgical History  Procedure Laterality Date  . Leg surgery    . Left wrist injury Left 07/2012    states that she fell on snow and glass    Family History  Problem Relation Age of Onset  . Hypertension Father   . Cancer Paternal Grandfather     History  Substance Use Topics  . Smoking status: Current Some Day Smoker -- 0.50 packs/day for 15 years    Types: Cigarettes  . Smokeless tobacco: Not on file  . Alcohol Use: 4.8 oz/week    8 Cans of beer per week     Comment: beer    OB History   Grav Para Term Preterm Abortions TAB SAB Ect Mult Living   3 2 2       2       Review of Systems  Constitutional: Positive for fever  (subjective) and chills.  Musculoskeletal:       Positive left wrist pain Positive right foot pain  All other systems reviewed and are negative.    Allergies  Review of patient's allergies indicates no known allergies.  Home Medications   Current Outpatient Rx  Name  Route  Sig  Dispense  Refill  . ibuprofen (ADVIL,MOTRIN) 200 MG tablet   Oral   Take 200 mg by mouth every 6 (six) hours as needed for pain.         . Prenatal Vit-Fe Fumarate-FA (PRENATAL MULTIVITAMIN) TABS   Oral   Take 1 tablet by mouth daily. For Prenatal Vitamin supplement         . metroNIDAZOLE (FLAGYL) 500 MG tablet   Oral   Take 4 tablets (2,000 mg total) by mouth once.   14 tablet   0     BP 113/68  Pulse 118  Temp(Src) 98.7 F (37.1 C) (Oral)  Resp 18  SpO2 98%  LMP 04/30/2012  Physical Exam  Nursing note and vitals reviewed. Constitutional: She appears well-developed and well-nourished.  HENT:  Head: Normocephalic and atraumatic.  Eyes:  Conjunctiva-moderate injection bilaterally  Neck:  Normal range of motion.  Cardiovascular: Normal rate, regular rhythm and normal heart sounds.   Pulmonary/Chest: Effort normal and breath sounds normal. No respiratory distress. She has no wheezes.  Musculoskeletal: Normal range of motion. She exhibits edema ( edema over volar surface of left wrist, under well healed scar) and tenderness ( TTP over left wrist.  No warmth, redness, or induration of wrist.  Palpable fluid filled sac over left wrist.  TTP over dorsal aspect of right foot. no ecchymosis or deformity of foot).  Neurological: She is alert.  Skin: Skin is warm and dry. No rash noted. No erythema.    ED Course  Procedures (including critical care time) DIAGNOSTIC STUDIES: Oxygen Saturation is 98% on room air, normal by my interpretation.    COORDINATION OF CARE: 9:38 PM- Patient informed of clinical course including XR of right foot and assessment of left wrist by attending.  Pt  understands medical decision-making process, and agrees with plan.   Dg Ankle Complete Right  08/29/2012  *RADIOLOGY REPORT*  Clinical Data: Persistent pain in the right ankle after fall 3 months ago.  RIGHT ANKLE - COMPLETE 3+ VIEW  Comparison: None.  Findings: The right ankle appears intact. No evidence of acute fracture or subluxation.  No focal bone lesions.  Bone matrix and cortex appear intact.  No abnormal radiopaque densities in the soft tissues.  IMPRESSION: No acute bony abnormalities.   Original Report Authenticated By: Burman Nieves, M.D.      1. Left wrist pain   2. Synovial cyst of wrist, left   3. Right foot pain       MDM  Pt was very aggitated throughout entire H&P.  Was difficult to elicit complete hx and physical.  Pt c/o of left wrist pain for 20mo following sutured laceration from a fall. Left wrist appeared to have synovial cyst beneath well healed scar.  No signs of infection.  No warmth, induration, erythema or red streaking.   Pt also c/o right foot pain but made it difficult to examine.  Stated her entire foot hurt when asked to point to the pain.    Pt refused drainage of fluid in wrist.    Right foot xray: no fx  Discharged home.  Pt did not want anything for pain since she is pregnant.  Advised pt to follow up with primary care as needed for pain of wrist and foot.  Reassured pt foot was not broken, however, unable to help further with left wrist since she would not allow me to drain the area.   Vitals: unremarkable. Discharged in stable condition.    Discussed pt with attending during ED encounter.  I personally performed the services described in this documentation, which was scribed in my presence. The recorded information has been reviewed and is accurate.    Junius Finner, PA-C 08/30/12 0127

## 2012-08-29 NOTE — ED Notes (Signed)
Pt discharged.Vital signs stable and GCS 15 the patient very uncooperative and using bad language to me.

## 2012-08-29 NOTE — ED Notes (Signed)
Pt has swollen hand from stitches removed months ago and states that her foot is hurt. Pt denies injury. Pt alert and oriented ambulatory with ETOH on board.

## 2012-08-30 NOTE — ED Provider Notes (Signed)
Medical screening examination/treatment/procedure(s) were performed by non-physician practitioner and as supervising physician I was immediately available for consultation/collaboration.  Glynn Octave, MD 08/30/12 [phone]

## 2012-09-03 ENCOUNTER — Encounter: Payer: Self-pay | Admitting: Advanced Practice Midwife

## 2012-09-03 DIAGNOSIS — R87810 Cervical high risk human papillomavirus (HPV) DNA test positive: Secondary | ICD-10-CM | POA: Insufficient documentation

## 2012-09-04 ENCOUNTER — Encounter: Payer: Medicaid Other | Admitting: Obstetrics & Gynecology

## 2012-09-04 ENCOUNTER — Ambulatory Visit (HOSPITAL_COMMUNITY): Payer: Medicaid Other

## 2012-09-07 ENCOUNTER — Ambulatory Visit (HOSPITAL_COMMUNITY)
Admission: RE | Admit: 2012-09-07 | Discharge: 2012-09-07 | Disposition: A | Discharge status: Home / Self Care | Payer: Medicaid Other | Source: Ambulatory Visit | Attending: Advanced Practice Midwife | Admitting: Advanced Practice Midwife

## 2012-09-07 DIAGNOSIS — O358XX0 Maternal care for other (suspected) fetal abnormality and damage, not applicable or unspecified: Secondary | ICD-10-CM | POA: Insufficient documentation

## 2012-09-07 DIAGNOSIS — Z3482 Encounter for supervision of other normal pregnancy, second trimester: Secondary | ICD-10-CM

## 2012-09-07 DIAGNOSIS — Z363 Encounter for antenatal screening for malformations: Secondary | ICD-10-CM | POA: Insufficient documentation

## 2012-09-07 DIAGNOSIS — O99332 Smoking (tobacco) complicating pregnancy, second trimester: Secondary | ICD-10-CM

## 2012-09-07 DIAGNOSIS — O09522 Supervision of elderly multigravida, second trimester: Secondary | ICD-10-CM

## 2012-09-07 DIAGNOSIS — O99312 Alcohol use complicating pregnancy, second trimester: Secondary | ICD-10-CM

## 2012-09-07 DIAGNOSIS — Z1389 Encounter for screening for other disorder: Secondary | ICD-10-CM | POA: Insufficient documentation

## 2012-09-07 DIAGNOSIS — O09529 Supervision of elderly multigravida, unspecified trimester: Secondary | ICD-10-CM | POA: Insufficient documentation

## 2012-09-07 DIAGNOSIS — O99330 Smoking (tobacco) complicating pregnancy, unspecified trimester: Secondary | ICD-10-CM | POA: Insufficient documentation

## 2012-09-11 ENCOUNTER — Other Ambulatory Visit: Payer: Self-pay | Admitting: Advanced Practice Midwife

## 2012-09-11 ENCOUNTER — Encounter: Payer: Self-pay | Admitting: Advanced Practice Midwife

## 2012-09-11 DIAGNOSIS — O09522 Supervision of elderly multigravida, second trimester: Secondary | ICD-10-CM

## 2012-09-11 NOTE — Progress Notes (Signed)
Attempted to call patient 2X; someone picks up and hangs the phone up. Please try again. Patient needs repeat u/s at 22 weeks and Schedule [facility] appointment no later than 2 weeks from now per [facility] note.

## 2012-09-12 NOTE — Progress Notes (Signed)
Called and spoke w/pt. I informed her of need for clinic appt and for follow up US appt. She agreed to clinic appt on 4/17 @ 1000. She will be informed of [place] appt details at time of clinic appt.  Pt voiced understanding of all information given.

## 2012-09-13 ENCOUNTER — Encounter (HOSPITAL_COMMUNITY): Payer: Self-pay | Admitting: *Deleted

## 2012-09-13 ENCOUNTER — Emergency Department (HOSPITAL_COMMUNITY)
Admission: EM | Admit: 2012-09-13 | Discharge: 2012-09-13 | Disposition: A | Discharge status: Home / Self Care | Payer: Medicaid Other | Attending: Emergency Medicine | Admitting: Emergency Medicine

## 2012-09-13 DIAGNOSIS — O99330 Smoking (tobacco) complicating pregnancy, unspecified trimester: Secondary | ICD-10-CM | POA: Insufficient documentation

## 2012-09-13 DIAGNOSIS — Z720 Tobacco use: Secondary | ICD-10-CM

## 2012-09-13 DIAGNOSIS — R109 Unspecified abdominal pain: Secondary | ICD-10-CM

## 2012-09-13 DIAGNOSIS — IMO0002 Reserved for concepts with insufficient information to code with codable children: Secondary | ICD-10-CM | POA: Insufficient documentation

## 2012-09-13 DIAGNOSIS — F1010 Alcohol abuse, uncomplicated: Secondary | ICD-10-CM

## 2012-09-13 DIAGNOSIS — O99340 Other mental disorders complicating pregnancy, unspecified trimester: Secondary | ICD-10-CM | POA: Insufficient documentation

## 2012-09-13 DIAGNOSIS — T7411XA Adult physical abuse, confirmed, initial encounter: Secondary | ICD-10-CM | POA: Insufficient documentation

## 2012-09-13 NOTE — ED Notes (Signed)
Pt seen leaving department through Triage. Patient had earlier stated that she would need to get out of here before the last bus. Pt not found in triage. Pt did have visitor with her.

## 2012-09-13 NOTE — ED Notes (Signed)
Pt very intoxicated, slurring speech and unable to keep eyes open.  Pt also repeatedly contradicts herself.

## 2012-09-13 NOTE — ED Notes (Signed)
Fetus appears normal via bedside ultrasound.  HR WNL.
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
## 2012-09-14 ENCOUNTER — Ambulatory Visit (INDEPENDENT_AMBULATORY_CARE_PROVIDER_SITE_OTHER): Payer: Medicaid Other | Admitting: Obstetrics & Gynecology

## 2012-09-14 VITALS — BP 134/73 | Temp 97.8°F | Wt 158.2 lb

## 2012-09-14 DIAGNOSIS — Z3482 Encounter for supervision of other normal pregnancy, second trimester: Secondary | ICD-10-CM

## 2012-09-14 DIAGNOSIS — F1920 Other psychoactive substance dependence, uncomplicated: Secondary | ICD-10-CM

## 2012-09-14 DIAGNOSIS — O09529 Supervision of elderly multigravida, unspecified trimester: Secondary | ICD-10-CM

## 2012-09-14 DIAGNOSIS — O99312 Alcohol use complicating pregnancy, second trimester: Secondary | ICD-10-CM

## 2012-09-14 DIAGNOSIS — O09522 Supervision of elderly multigravida, second trimester: Secondary | ICD-10-CM

## 2012-09-14 LAB — POCT URINALYSIS DIP (DEVICE)
Bilirubin Urine: NEGATIVE
Ketones, ur: NEGATIVE mg/dL
Protein, ur: NEGATIVE mg/dL
Specific Gravity, Urine: 1.015 (ref 1.005–1.030)

## 2012-09-14 NOTE — ED Provider Notes (Signed)
This patient was seen by the resident, but left AMA prior to my evaluation  Rolan Bucco, MD 09/14/12 1506

## 2012-09-14 NOTE — Patient Instructions (Signed)
Risks of Alcohol Use in Pregnancy Alcohol is the biggest cause of mental retardation in babies. Women who are pregnant or who may become pregnant should not drink alcohol. This will lessen the chance of giving birth to a baby with any of the harmful effects of FASD (Fetal Alcohol Spectrum Disorders). FASD is the full spectrum of birth defects caused by drinking while pregnant. The spectrum may include mild changes, such as a slight learning disability. Or it could be full FAS (Fetal Alcohol Syndrome). FAS is a permanent condition that can include: severe learning disabilities, decreased growth (growth deficiencies), abnormal facial features, brain (central nervous system) disorders, and behavior problems. It is unsafe to drink any amount of alcohol when pregnant. When the mother drinks alcohol, so does the baby. The amount of alcohol in the mother's blood shows up as the same amount found in the baby's blood. Alcohol is digested by the mature liver in the mother, but the liver is not mature in the baby. As a result, alcohol affects the baby much more than it affects the mother. The [employer] of Obstetricians and Gynecologists recommend the safest way to prevent problems to the baby is to not drink any amount of alcohol when pregnant. One of the most preventable causes of a baby developing FASD, especially FAS, is drinking alcohol. FAS is a lifelong physically and mentally disabling condition. The safest thing to do is not drink alcohol when pregnant. RISKS  Alcohol consumed during pregnancy increases the risk of alcohol related birth defects. These include:  Central nervous system disorders.  Decreased growth.  Delayed intellectual development.  Behavioral disorders.  Abnormal facial features.  Mental retardation.  Sleep and sucking disorders.  Children with FAS are at risk of:  Having psychiatric problems.  Criminal behavior.  Unemployment.  Not finishing their education.  No  amount of alcohol can be considered safe during pregnancy.  Alcohol can damage a fetus at any stage of pregnancy. Damage can occur in the early weeks of pregnancy. This is even before a woman knows that she is pregnant.  The learning (cognitive) and behavior problems due to prenatal alcohol exposure are lifelong.  Alcohol-related birth defects are completely preventable.  There is an increased risk of miscarriage and fetal death.  Fetal growth retardation.  Increase in accidents at home, away, and driving.  Drinking alcohol can easily lead to abuse of drugs and smoking. SYMPTOMS  Drinking alcohol slows down the function of your body and brain. It negatively affects your:  Walking (unstable).  Hearing.  Vision.  Daily activities.  Driving.  Mind and memory.  Talking (slurred speech).  It contributes to bad diet and nutrition, which are important for the growth and development of the baby.  In time, it will also cause problems with your health, your relationship with your family, friends, and work. DIAGNOSIS   Do not wait for symptoms to develop to make the diagnosis.  At the first prenatal office visit, the caregiver and patient should discuss alcohol drinking when pregnant.  If the mother is drinking alcohol, both the caregiver and patient should make a serious attempt to stop the drinking completely. TREATMENT   The best treatment is to not drink or stop drinking.  If someone needs help to stop drinking, she should contact the local Alcoholics Anonymous chapter. Or contact a treatment center. The Substance Abuse and Mental Health Services [facility]) can help people find local centers in their area.  Intervention by the spouse, family, and friends.  Counseling by pastor, psychologist, or psychiatrist.  Hospitalization may be necessary.  If the spouse drinks alcohol, he should stop. HOME CARE INSTRUCTIONS   A woman should not drink alcohol while she is  pregnant.  A pregnant woman who has already consumed alcohol during her pregnancy should stop immediately. This will reduce further risk to the baby.  A woman who is considering becoming pregnant should not drink alcohol.  Women of child-bearing age should talk to their caregivers about this issue. Then take steps to reduce the chance of drinking alcohol while pregnant.  The father plays an important role in helping the mother abstain from drinking alcohol. He can encourage her to abstain and should abstain himself.  Avoiding social events where alcohol is served will help, too.  If you think your baby has FASD or FAS, call your caregiver. FOR MORE INFORMATION: The [employer] on Alcohol Abuse and Alcoholism, NIH: BasicStudents.dk The Substance Abuse and Mental Health Services Administration: www.fascenter.RockToxic.pl Document Released: 03/14/2007 Document Revised: 08/09/2011 Document Reviewed: 03/26/2009 [facility] Patient Information 2013 [place], [place].

## 2012-09-14 NOTE — Progress Notes (Signed)
Pulse- 104  Edema-feet   Pain/pressure- pelvic

## 2012-09-14 NOTE — Progress Notes (Signed)
Needs f/u US to recheck anatomy. [place] was o/w normal 4/11. Trying to minimize alcohol consuption

## 2012-09-20 ENCOUNTER — Encounter: Payer: Medicaid Other | Admitting: Advanced Practice Midwife

## 2012-10-02 ENCOUNTER — Ambulatory Visit (HOSPITAL_COMMUNITY)
Admission: RE | Admit: 2012-10-02 | Discharge: 2012-10-02 | Disposition: A | Discharge status: Home / Self Care | Payer: Medicaid Other | Source: Ambulatory Visit | Attending: Advanced Practice Midwife | Admitting: Advanced Practice Midwife

## 2012-10-02 ENCOUNTER — Encounter: Payer: Self-pay | Admitting: Advanced Practice Midwife

## 2012-10-02 DIAGNOSIS — O99330 Smoking (tobacco) complicating pregnancy, unspecified trimester: Secondary | ICD-10-CM | POA: Insufficient documentation

## 2012-10-02 DIAGNOSIS — O09529 Supervision of elderly multigravida, unspecified trimester: Secondary | ICD-10-CM | POA: Insufficient documentation

## 2012-10-02 DIAGNOSIS — O09522 Supervision of elderly multigravida, second trimester: Secondary | ICD-10-CM

## 2012-10-02 DIAGNOSIS — Z3689 Encounter for other specified antenatal screening: Secondary | ICD-10-CM | POA: Insufficient documentation

## 2012-10-11 ENCOUNTER — Telehealth: Payer: Self-pay | Admitting: *Deleted

## 2012-10-11 NOTE — Telephone Encounter (Signed)
[name] called and left a message she is having pressure in her lower stomach ,states is painful and that she is about 6 months pregnant- request a call for [place] to tell her what to do. Per chart review [name] is [redacted] weeks pregnant, no history of preterm labor, and has an appointment for ob follow up for tomorrow am. [name] and she denies any contractions, denies vaginal bleeding, denies abnormal vaginal discharge, denies constipation.  States the pressure just occurred today and lasted for over an hour but is gone right now.  We discussed she should keep her appointment tomorrow but come to [facility] before then if she starts having contractions , or any vaginal bleeding or if the pressure comes back and is worsening or she feels like it can't wait until tomorrow am. Patient voices understanding.

## 2012-10-12 ENCOUNTER — Ambulatory Visit (INDEPENDENT_AMBULATORY_CARE_PROVIDER_SITE_OTHER): Payer: Medicaid Other | Admitting: Obstetrics & Gynecology

## 2012-10-12 ENCOUNTER — Encounter: Payer: Self-pay | Admitting: Obstetrics & Gynecology

## 2012-10-12 VITALS — BP 122/78 | Temp 97.1°F | Wt 161.6 lb

## 2012-10-12 DIAGNOSIS — Z3482 Encounter for supervision of other normal pregnancy, second trimester: Secondary | ICD-10-CM

## 2012-10-12 DIAGNOSIS — O99340 Other mental disorders complicating pregnancy, unspecified trimester: Secondary | ICD-10-CM

## 2012-10-12 NOTE — Patient Instructions (Signed)
Risks of Alcohol Use in Pregnancy Alcohol is the biggest cause of mental retardation in babies. Women who are pregnant or who may become pregnant should not drink alcohol. This will lessen the chance of giving birth to a baby with any of the harmful effects of FASD (Fetal Alcohol Spectrum Disorders). FASD is the full spectrum of birth defects caused by drinking while pregnant. The spectrum may include mild changes, such as a slight learning disability. Or it could be full FAS (Fetal Alcohol Syndrome). FAS is a permanent condition that can include: severe learning disabilities, decreased growth (growth deficiencies), abnormal facial features, brain (central nervous system) disorders, and behavior problems. It is unsafe to drink any amount of alcohol when pregnant. When the mother drinks alcohol, so does the baby. The amount of alcohol in the mother's blood shows up as the same amount found in the baby's blood. Alcohol is digested by the mature liver in the mother, but the liver is not mature in the baby. As a result, alcohol affects the baby much more than it affects the mother. The American College of Obstetricians and Gynecologists recommend the safest way to prevent problems to the baby is to not drink any amount of alcohol when pregnant. One of the most preventable causes of a baby developing FASD, especially FAS, is drinking alcohol. FAS is a lifelong physically and mentally disabling condition. The safest thing to do is not drink alcohol when pregnant. RISKS  Alcohol consumed during pregnancy increases the risk of alcohol related birth defects. These include:  Central nervous system disorders.  Decreased growth.  Delayed intellectual development.  Behavioral disorders.  Abnormal facial features.  Mental retardation.  Sleep and sucking disorders.  Children with FAS are at risk of:  Having psychiatric problems.  Criminal behavior.  Unemployment.  Not finishing their education.  No  amount of alcohol can be considered safe during pregnancy.  Alcohol can damage a fetus at any stage of pregnancy. Damage can occur in the early weeks of pregnancy. This is even before a woman knows that she is pregnant.  The learning (cognitive) and behavior problems due to prenatal alcohol exposure are lifelong.  Alcohol-related birth defects are completely preventable.  There is an increased risk of miscarriage and fetal death.  Fetal growth retardation.  Increase in accidents at home, away, and driving.  Drinking alcohol can easily lead to abuse of drugs and smoking. SYMPTOMS  Drinking alcohol slows down the function of your body and brain. It negatively affects your:  Walking (unstable).  Hearing.  Vision.  Daily activities.  Driving.  Mind and memory.  Talking (slurred speech).  It contributes to bad diet and nutrition, which are important for the growth and development of the baby.  In time, it will also cause problems with your health, your relationship with your family, friends, and work. DIAGNOSIS   Do not wait for symptoms to develop to make the diagnosis.  At the first prenatal office visit, the caregiver and patient should discuss alcohol drinking when pregnant.  If the mother is drinking alcohol, both the caregiver and patient should make a serious attempt to stop the drinking completely. TREATMENT   The best treatment is to not drink or stop drinking.  If someone needs help to stop drinking, she should contact the local Alcoholics Anonymous chapter. Or contact a treatment center. The Substance Abuse and Mental Health Services (SAMHSA) can help people find local centers in their area.  Intervention by the spouse, family, and friends.    Counseling by pastor, psychologist, or psychiatrist.  Hospitalization may be necessary.  If the spouse drinks alcohol, he should stop. HOME CARE INSTRUCTIONS   A woman should not drink alcohol while she is  pregnant.  A pregnant woman who has already consumed alcohol during her pregnancy should stop immediately. This will reduce further risk to the baby.  A woman who is considering becoming pregnant should not drink alcohol.  Women of child-bearing age should talk to their caregivers about this issue. Then take steps to reduce the chance of drinking alcohol while pregnant.  The father plays an important role in helping the mother abstain from drinking alcohol. He can encourage her to abstain and should abstain himself.  Avoiding social events where alcohol is served will help, too.  If you think your baby has FASD or FAS, call your caregiver. FOR MORE INFORMATION: The National Institute on Alcohol Abuse and Alcoholism, NIH: www.niaaa.nih.gov The Substance Abuse and Mental Health Services Administration: www.fascenter.samhsa.gov Document Released: 03/14/2007 Document Revised: 08/09/2011 Document Reviewed: 03/26/2009 ExitCare Patient Information 2013 [employer].  

## 2012-10-12 NOTE — Progress Notes (Signed)
LMP was unsure, EDC by [place] 8.4 weeks. Still trying to drink less, now about 3-4 drinks Q 2 days

## 2012-11-02 ENCOUNTER — Encounter: Payer: Medicaid Other | Admitting: Family Medicine

## 2013-04-27 ENCOUNTER — Encounter (HOSPITAL_COMMUNITY): Payer: Self-pay | Admitting: *Deleted

## 2013-06-04 ENCOUNTER — Emergency Department (INDEPENDENT_AMBULATORY_CARE_PROVIDER_SITE_OTHER)
Admission: EM | Admit: 2013-06-04 | Discharge: 2013-06-04 | Disposition: A | Discharge status: Home / Self Care | Payer: Medicaid Other | Source: Home / Self Care | Attending: Emergency Medicine | Admitting: Emergency Medicine

## 2013-06-04 ENCOUNTER — Encounter (HOSPITAL_COMMUNITY): Payer: Self-pay | Admitting: Emergency Medicine

## 2013-06-04 DIAGNOSIS — J069 Acute upper respiratory infection, unspecified: Secondary | ICD-10-CM

## 2013-06-04 MED ORDER — IBUPROFEN 800 MG PO TABS: 800.0000 mg | ORAL_TABLET | Freq: Three times a day (TID) | ORAL | Status: DC

## 2013-06-04 MED ORDER — BENZONATATE 100 MG PO CAPS: 100.0000 mg | ORAL_CAPSULE | Freq: Three times a day (TID) | ORAL | Status: DC
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
## 2013-06-04 NOTE — Discharge Instructions (Signed)
Cough, Adult ° A cough is a reflex. It helps you clear your throat and airways. A cough can help heal your body. A cough can last 2 or 3 weeks (acute) or may last more than 8 weeks (chronic). Some common causes of a cough can include an infection, allergy, or a cold. °HOME CARE °· Only take medicine as told by your doctor. °· If given, take your medicines (antibiotics) as told. Finish them even if you start to feel better. °· Use a cold steam vaporizer or humidier in your home. This can help loosen thick spit (secretions). °· Sleep so you are almost sitting up (semi-upright). Use pillows to do this. This helps reduce coughing. °· Rest as needed. °· Stop smoking if you smoke. °GET HELP RIGHT AWAY IF: °· You have yellowish-white fluid (pus) in your thick spit. °· Your cough gets worse. °· Your medicine does not reduce coughing, and you are losing sleep. °· You cough up blood. °· You have trouble breathing. °· Your pain gets worse and medicine does not help. °· You have a fever. °MAKE SURE YOU:  °· Understand these instructions. °· Will watch your condition. °· Will get help right away if you are not doing well or get worse. °Document Released: 01/28/2011 Document Revised: 08/09/2011 Document Reviewed: 01/28/2011 °ExitCare® Patient Information ©2014 [employer]. °Upper Respiratory Infection, Adult °An upper respiratory infection (URI) is also known as the common cold. It is often caused by a type of germ (virus). Colds are easily spread (contagious). You can pass it to others by kissing, coughing, sneezing, or drinking out of the same glass. Usually, you get better in 1 or 2 weeks.  °HOME CARE  °· Only take medicine as told by your doctor. °· Use a warm mist humidifier or breathe in steam from a hot shower. °· Drink enough water and fluids to keep your pee (urine) clear or pale yellow. °· Get plenty of rest. °· Return to work when your temperature is back to normal or as told by your doctor. You may use a face mask  and wash your hands to stop your cold from spreading. °GET HELP RIGHT AWAY IF:  °· After the first few days, you feel you are getting worse. °· You have questions about your medicine. °· You have chills, shortness of breath, or brown or red spit (mucus). °· You have yellow or brown snot (nasal discharge) or pain in the face, especially when you bend forward. °· You have a fever, puffy (swollen) neck, pain when you swallow, or white spots in the back of your throat. °· You have a bad headache, ear pain, sinus pain, or chest pain. °· You have a high-pitched whistling sound when you breathe in and out (wheezing). °· You have a lasting cough or cough up blood. °· You have sore muscles or a stiff neck. °MAKE SURE YOU:  °· Understand these instructions. °· Will watch your condition. °· Will get help right away if you are not doing well or get worse. °Document Released: 11/03/2007 Document Revised: 08/09/2011 Document Reviewed: 09/21/2010 °ExitCare® Patient Information ©2014 [employer]. ° °

## 2013-06-04 NOTE — ED Notes (Signed)
Cough, congestion onset yesterday.

## 2013-06-04 NOTE — ED Notes (Signed)
Mother and child being treated at [facility]/same treatment room

## 2013-06-04 NOTE — ED Provider Notes (Signed)
Medical screening examination/treatment/procedure(s) were performed by non-physician practitioner and as supervising physician I was immediately available for consultation/collaboration.  Meet [place], M.D.  Nysha Koplin C Dynesha Woolen, MD 06/04/13 1543 

## 2013-07-30 ENCOUNTER — Emergency Department (HOSPITAL_COMMUNITY)
Admission: EM | Admit: 2013-07-30 | Discharge: 2013-07-30 | Discharge status: Absent without leave | Payer: Medicaid Other | Source: Home / Self Care

## 2013-11-19 IMAGING — CR DG WRIST COMPLETE 3+V*L*
4 series · 4 of 4 positions shown · non-contrast
Comparison: None.

CLINICAL DATA: Status post fall; laceration to the left wrist.
Worsening pain and bleeding.

LEFT WRIST - COMPLETE 3+ VIEW

[x wrist pa left]
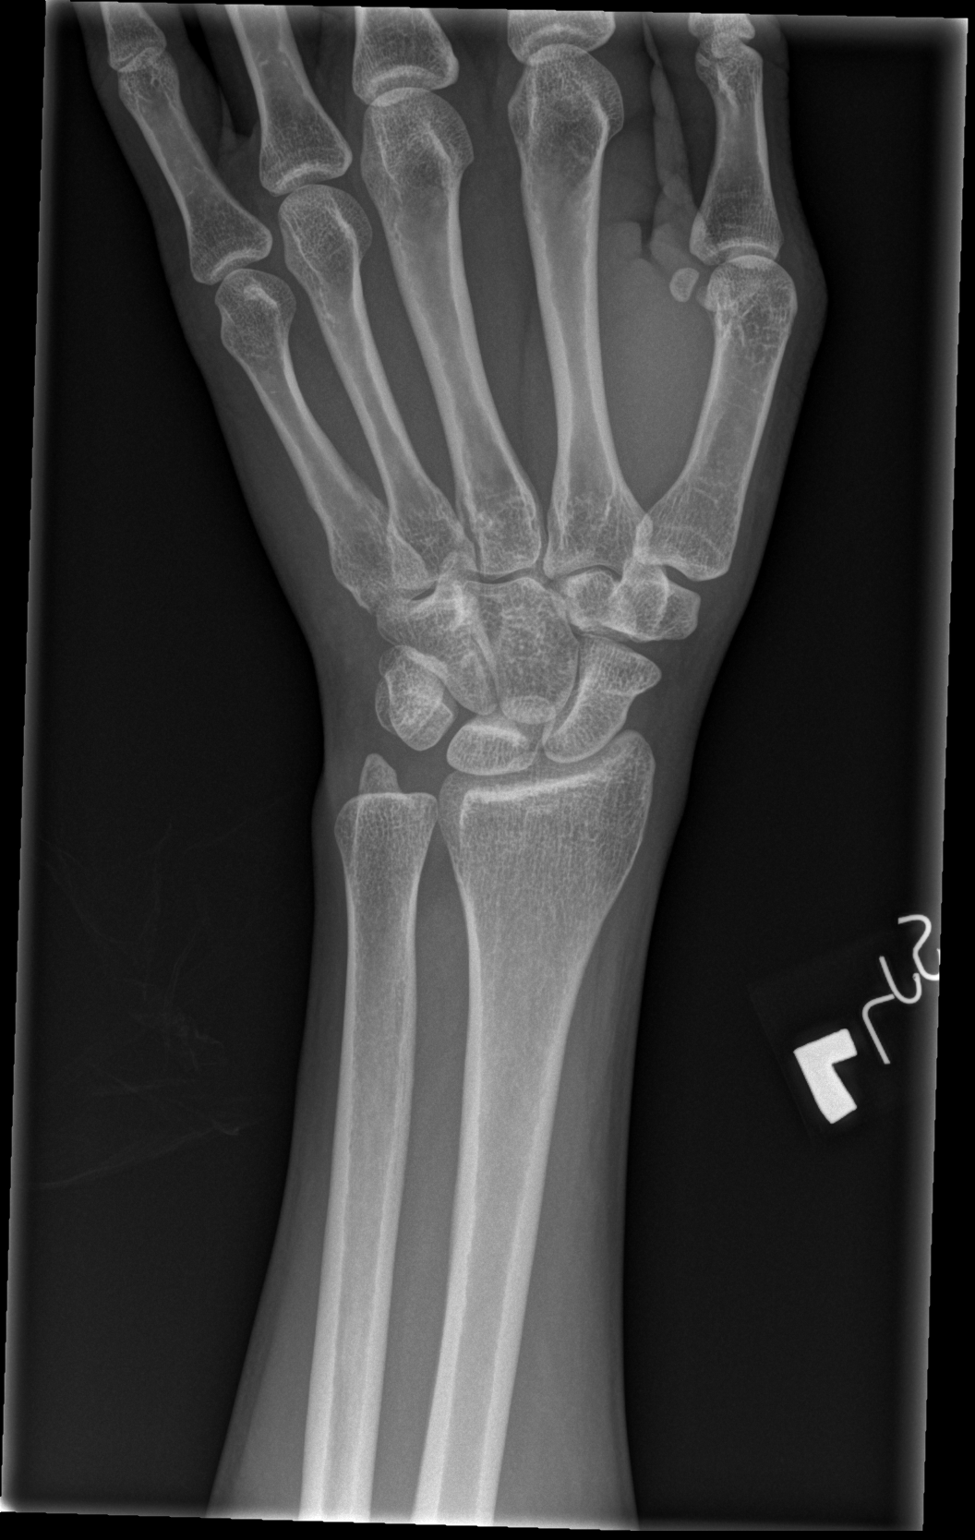

[x wrist obl left]
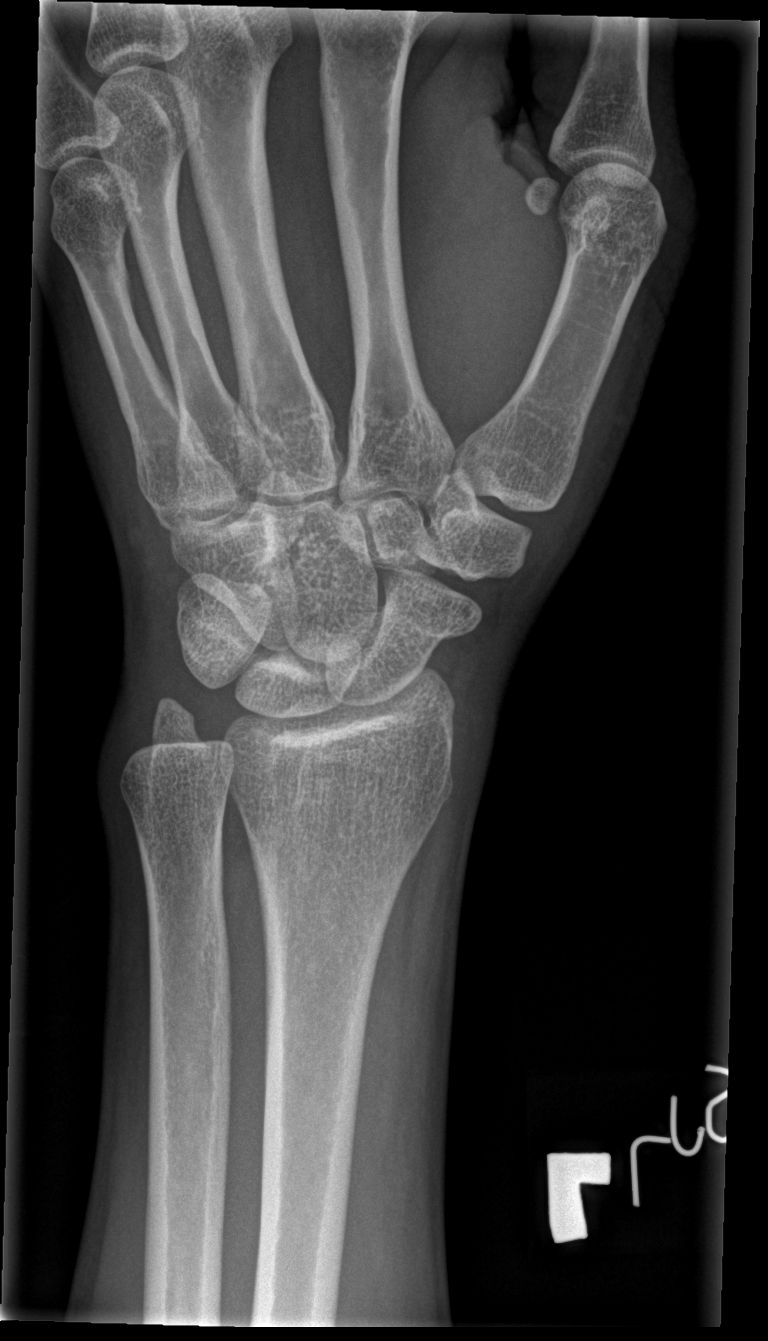

[x wrist lat left]
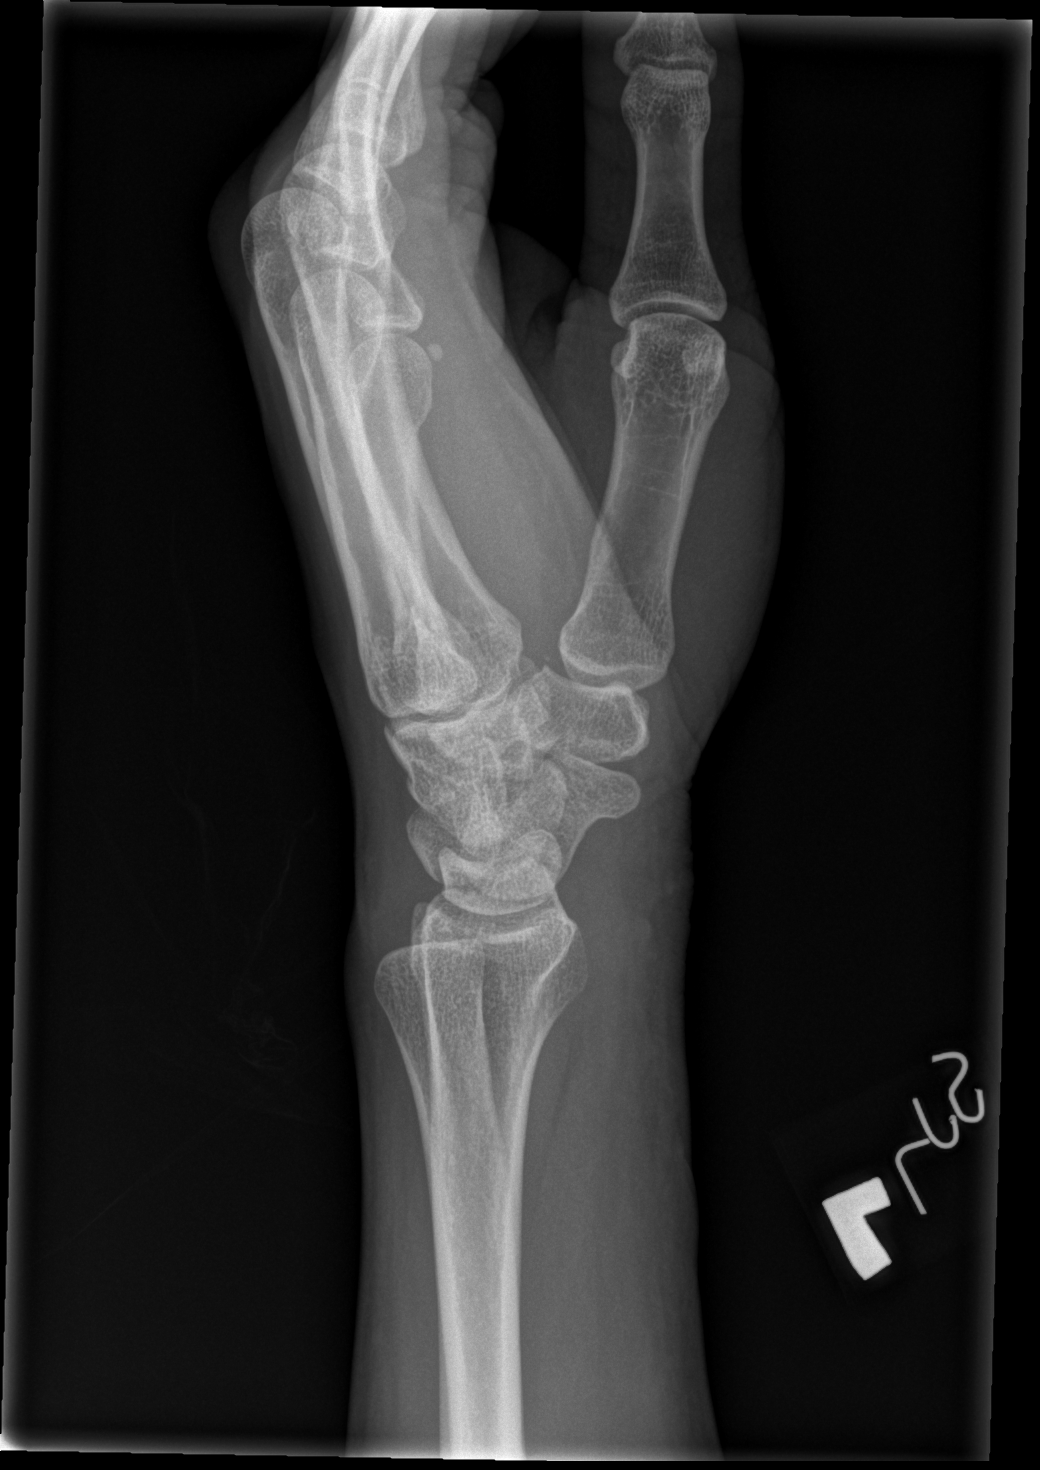

[x wrist navicular view left]
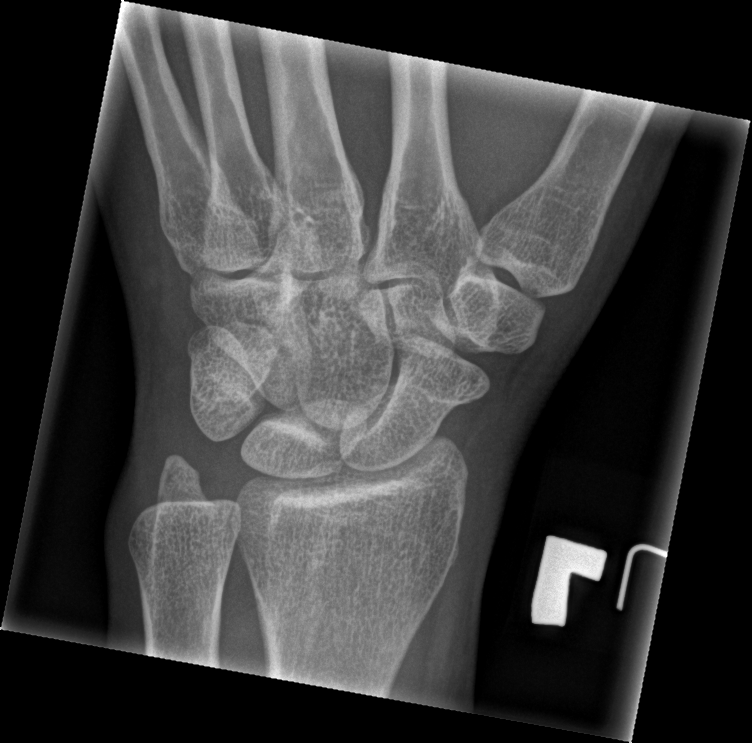

[4 of 4 positions shown; findings below may reference images not displayed]

FINDINGS: There is no evidence of fracture or dislocation.  The
carpal rows are intact, and demonstrate normal alignment.  The
joint spaces are preserved.

The known soft tissue laceration is not well characterized on
radiograph.  No radiopaque foreign bodies are identified.
IMPRESSION: No evidence of fracture or dislocation; no radiopaque foreign
bodies seen.

## 2014-01-06 IMAGING — CR DG ANKLE COMPLETE 3+V*R*
3 series · 3 of 3 positions shown · non-contrast
Comparison: None.

CLINICAL DATA: Persistent pain in the right ankle after fall 3
months ago.

RIGHT ANKLE - COMPLETE 3+ VIEW

[x ankle ap right]
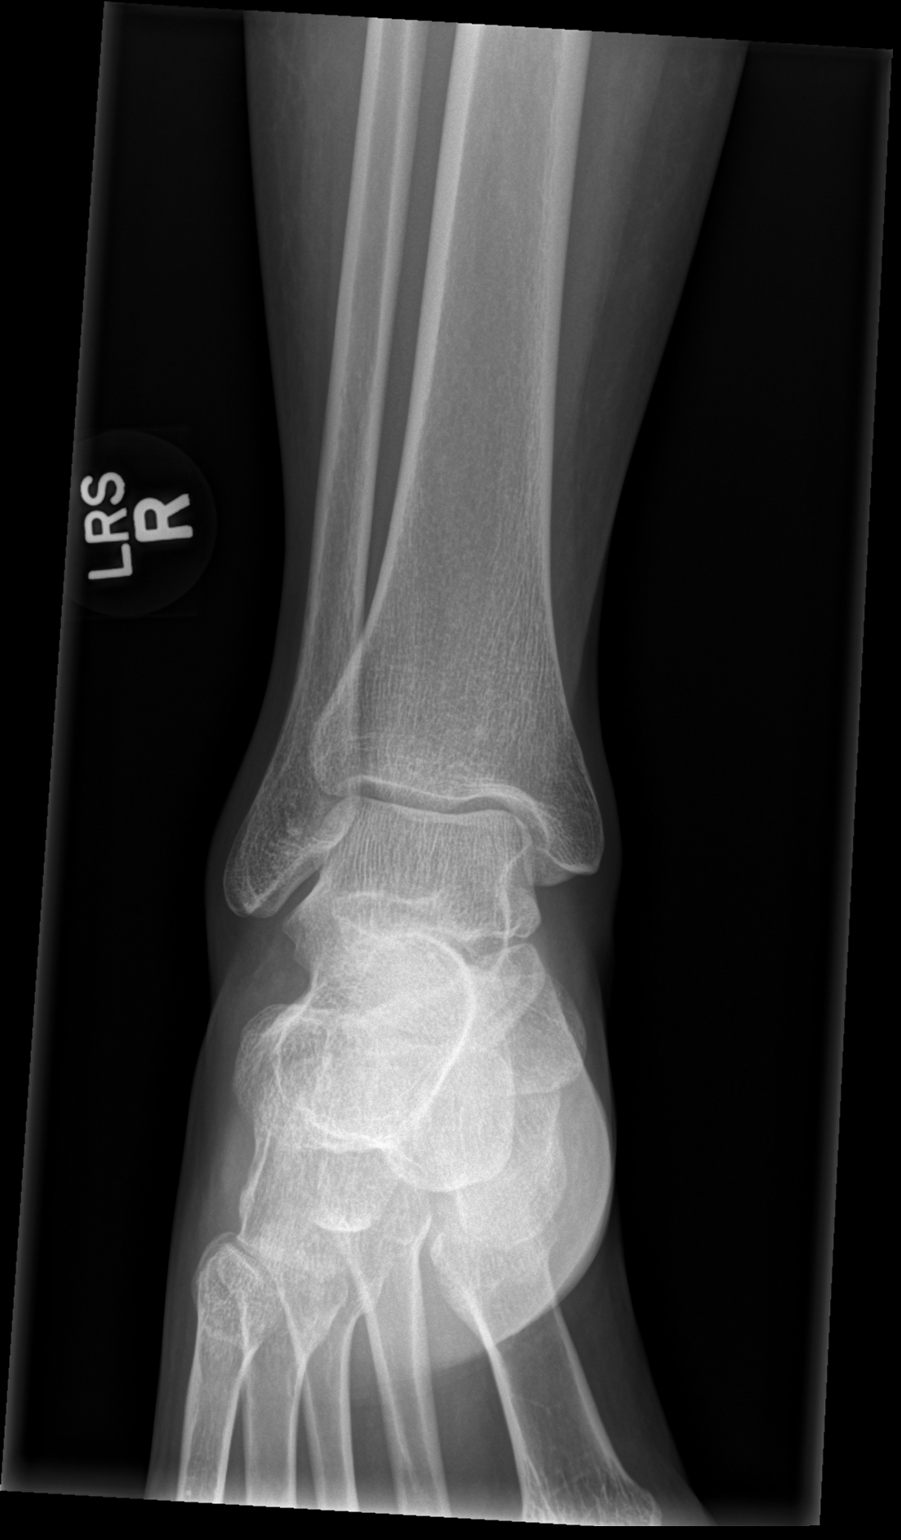

[x ankle obl right]
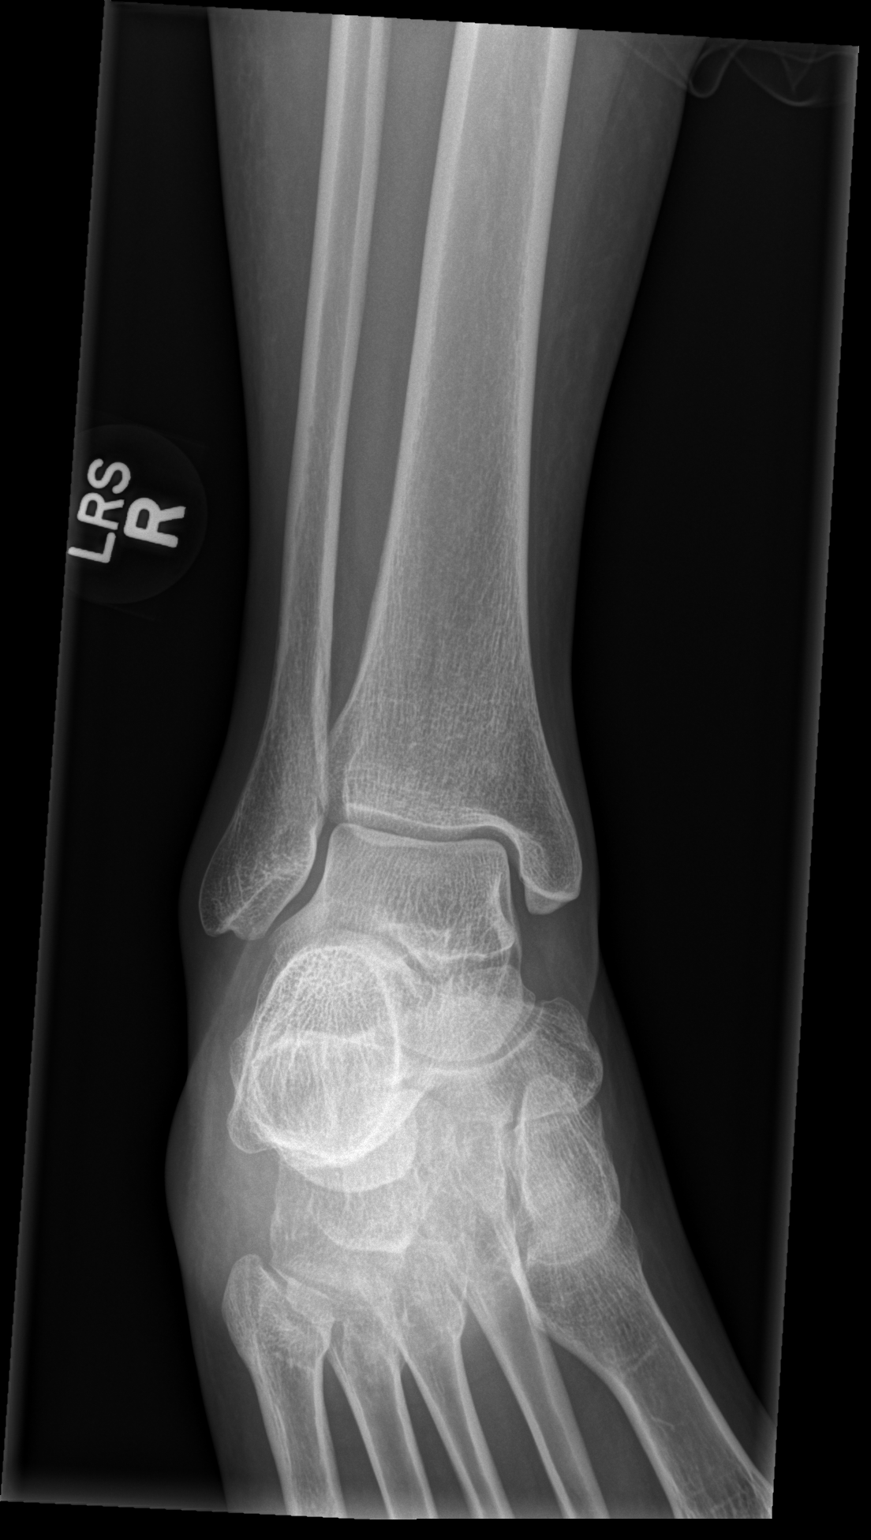

[x ankle lat right]
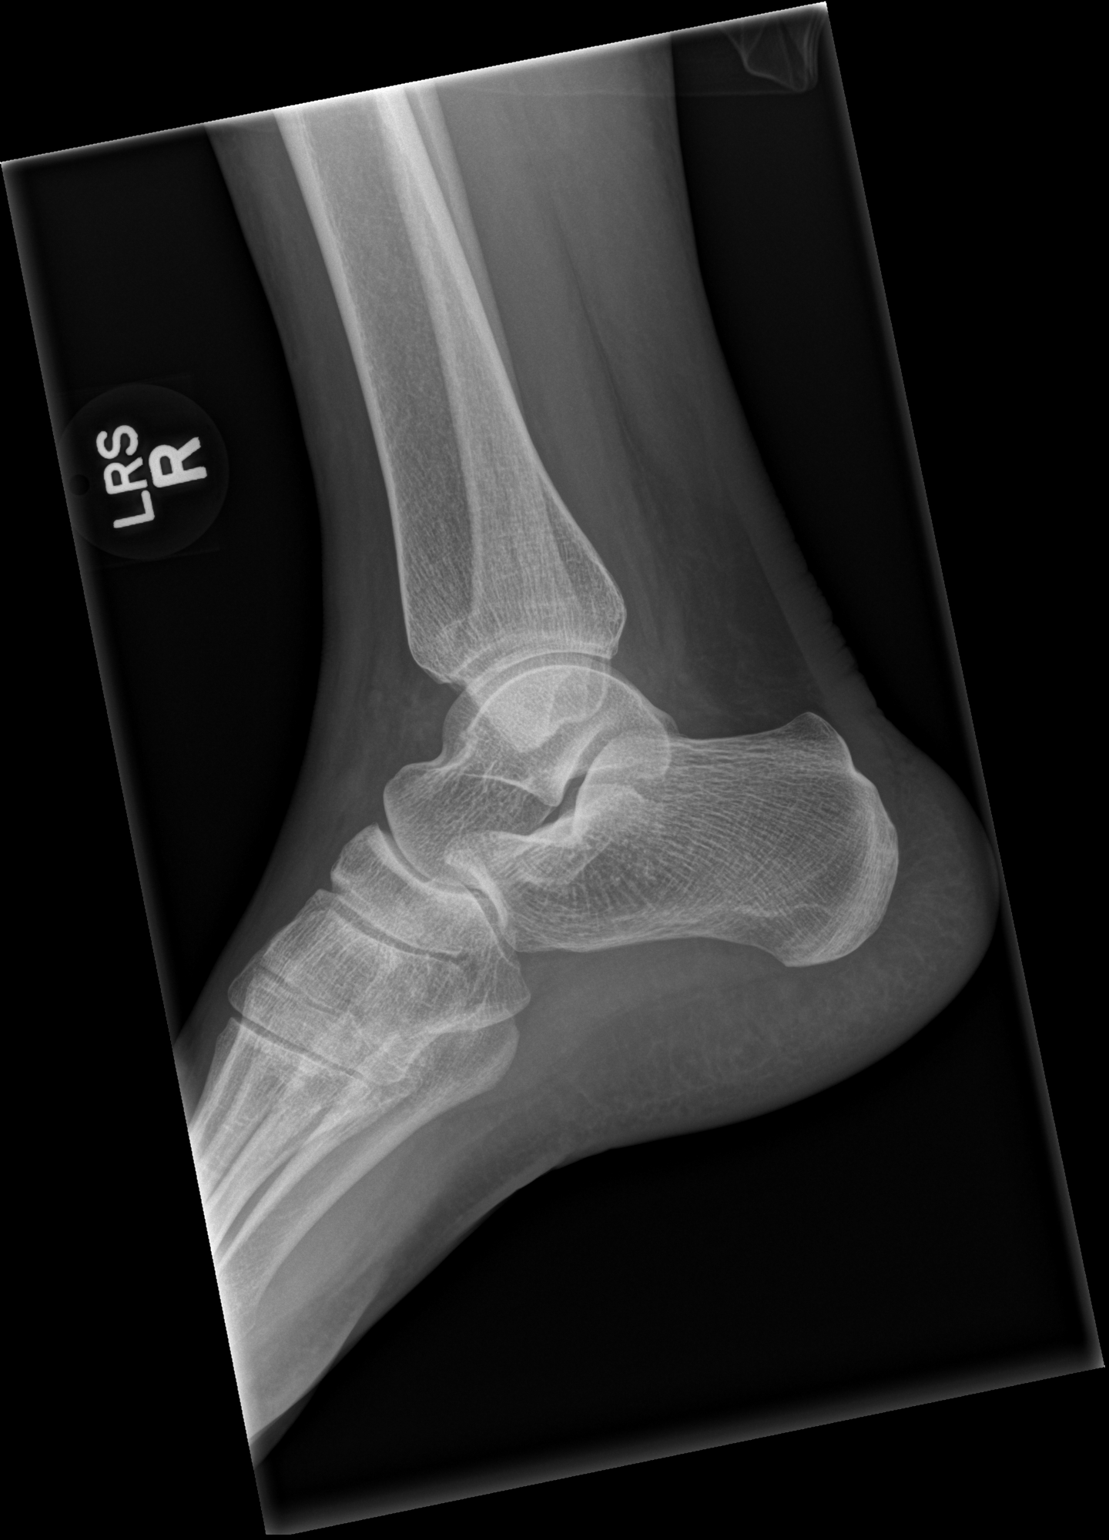

[3 of 3 positions shown; findings below may reference images not displayed]

FINDINGS: The right ankle appears intact. No evidence of acute
fracture or subluxation.  No focal bone lesions.  Bone matrix and
cortex appear intact.  No abnormal radiopaque densities in the soft
tissues.
IMPRESSION: No acute bony abnormalities.

## 2014-04-01 ENCOUNTER — Encounter (HOSPITAL_COMMUNITY): Payer: Self-pay | Admitting: Emergency Medicine

## 2014-04-19 ENCOUNTER — Emergency Department (HOSPITAL_COMMUNITY): Payer: Medicaid Other

## 2014-04-19 ENCOUNTER — Emergency Department (HOSPITAL_COMMUNITY)
Admission: EM | Admit: 2014-04-19 | Discharge: 2014-04-20 | Disposition: A | Discharge status: Other Acute Inpatient Hospital | Payer: Medicaid Other | Attending: Emergency Medicine | Admitting: Emergency Medicine

## 2014-04-19 ENCOUNTER — Encounter (HOSPITAL_COMMUNITY): Payer: Self-pay | Admitting: *Deleted

## 2014-04-19 DIAGNOSIS — R000 Tachycardia, unspecified: Secondary | ICD-10-CM | POA: Insufficient documentation

## 2014-04-19 DIAGNOSIS — I10 Essential (primary) hypertension: Secondary | ICD-10-CM | POA: Diagnosis not present

## 2014-04-19 DIAGNOSIS — Z3202 Encounter for pregnancy test, result negative: Secondary | ICD-10-CM | POA: Diagnosis not present

## 2014-04-19 DIAGNOSIS — F10129 Alcohol abuse with intoxication, unspecified: Secondary | ICD-10-CM | POA: Insufficient documentation

## 2014-04-19 DIAGNOSIS — F1010 Alcohol abuse, uncomplicated: Secondary | ICD-10-CM

## 2014-04-19 DIAGNOSIS — R079 Chest pain, unspecified: Secondary | ICD-10-CM | POA: Insufficient documentation

## 2014-04-19 DIAGNOSIS — Z791 Long term (current) use of non-steroidal anti-inflammatories (NSAID): Secondary | ICD-10-CM | POA: Insufficient documentation

## 2014-04-19 DIAGNOSIS — Z720 Tobacco use: Secondary | ICD-10-CM | POA: Diagnosis not present

## 2014-04-19 DIAGNOSIS — R1013 Epigastric pain: Secondary | ICD-10-CM | POA: Diagnosis not present

## 2014-04-19 DIAGNOSIS — Z008 Encounter for other general examination: Secondary | ICD-10-CM | POA: Diagnosis present

## 2014-04-19 LAB — ETHANOL
ALCOHOL ETHYL (B): 326 mg/dL — AB (ref 0–11)
Alcohol, Ethyl (B): 366 mg/dL — ABNORMAL HIGH (ref 0–11)

## 2014-04-19 LAB — COMPREHENSIVE METABOLIC PANEL
ALK PHOS: 62 U/L (ref 39–117)
ALT: 65 U/L — AB (ref 0–35)
ANION GAP: 16 — AB (ref 5–15)
AST: 70 U/L — ABNORMAL HIGH (ref 0–37)
Albumin: 4.1 g/dL (ref 3.5–5.2)
BUN: 8 mg/dL (ref 6–23)
CHLORIDE: 105 meq/L (ref 96–112)
CO2: 20 meq/L (ref 19–32)
Calcium: 9 mg/dL (ref 8.4–10.5)
Creatinine, Ser: 0.51 mg/dL (ref 0.50–1.10)
GLUCOSE: 80 mg/dL (ref 70–99)
POTASSIUM: 4.1 meq/L (ref 3.7–5.3)
SODIUM: 141 meq/L (ref 137–147)
Total Protein: 7.9 g/dL (ref 6.0–8.3)

## 2014-04-19 LAB — URINALYSIS, ROUTINE W REFLEX MICROSCOPIC
Bilirubin Urine: NEGATIVE
GLUCOSE, UA: NEGATIVE mg/dL
KETONES UR: NEGATIVE mg/dL
LEUKOCYTES UA: NEGATIVE
Nitrite: NEGATIVE
PROTEIN: NEGATIVE mg/dL
Specific Gravity, Urine: 1.009 (ref 1.005–1.030)
UROBILINOGEN UA: 0.2 mg/dL (ref 0.0–1.0)
pH: 5 (ref 5.0–8.0)

## 2014-04-19 LAB — URINE MICROSCOPIC-ADD ON

## 2014-04-19 LAB — CBC
HEMATOCRIT: 35.9 % — AB (ref 36.0–46.0)
HEMOGLOBIN: 11.8 g/dL — AB (ref 12.0–15.0)
MCH: 28.2 pg (ref 26.0–34.0)
MCHC: 32.9 g/dL (ref 30.0–36.0)
MCV: 85.9 fL (ref 78.0–100.0)
Platelets: 232 10*3/uL (ref 150–400)
RBC: 4.18 MIL/uL (ref 3.87–5.11)
RDW: 15.2 % (ref 11.5–15.5)
WBC: 4.8 10*3/uL (ref 4.0–10.5)

## 2014-04-19 LAB — RAPID URINE DRUG SCREEN, HOSP PERFORMED
AMPHETAMINES: NOT DETECTED
BARBITURATES: NOT DETECTED
Benzodiazepines: NOT DETECTED
Cocaine: NOT DETECTED
OPIATES: NOT DETECTED
TETRAHYDROCANNABINOL: NOT DETECTED

## 2014-04-19 LAB — I-STAT BETA HCG BLOOD, ED (MC, WL, AP ONLY): I-stat hCG, quantitative: <5 m[IU]/mL (ref <5)

## 2014-04-19 LAB — LIPASE, BLOOD: LIPASE: 34 U/L (ref 11–59)

## 2014-04-19 LAB — SALICYLATE LEVEL: Salicylate Lvl: <2 mg/dL — ABNORMAL LOW (ref 2.8–20.0)

## 2014-04-19 LAB — ACETAMINOPHEN LEVEL

## 2014-04-19 LAB — TROPONIN I: Troponin I: <0.3 ng/mL (ref <0.30)

## 2014-04-19 MED ORDER — PANTOPRAZOLE SODIUM 40 MG PO TBEC
80.0000 mg | DELAYED_RELEASE_TABLET | Freq: Once | ORAL | Status: AC
  Administered 2014-04-19: 80 mg via ORAL
  Filled 2014-04-19: qty 2

## 2014-04-19 MED ORDER — VITAMIN B-1 100 MG PO TABS
100.0000 mg | ORAL_TABLET | Freq: Every day | ORAL | Status: DC
  Administered 2014-04-19: 100 mg via ORAL
  Filled 2014-04-19: qty 1

## 2014-04-19 MED ORDER — SODIUM CHLORIDE 0.9 % IV BOLUS (SEPSIS)
1000.0000 mL | Freq: Once | INTRAVENOUS | Status: AC
  Administered 2014-04-19: 1000 mL via INTRAVENOUS

## 2014-04-19 MED ORDER — ASPIRIN 81 MG PO CHEW
324.0000 mg | CHEWABLE_TABLET | Freq: Once | ORAL | Status: AC
  Administered 2014-04-19: 324 mg via ORAL
  Filled 2014-04-19: qty 4

## 2014-04-19 MED ORDER — LORAZEPAM 1 MG PO TABS: 0.0000 mg | ORAL_TABLET | Freq: Four times a day (QID) | ORAL | Status: DC

## 2014-04-19 MED ORDER — LORAZEPAM 1 MG PO TABS: 0.0000 mg | ORAL_TABLET | Freq: Two times a day (BID) | ORAL | Status: DC

## 2014-04-19 MED ORDER — THIAMINE HCL 100 MG/ML IJ SOLN: 100.0000 mg | Freq: Every day | INTRAMUSCULAR | Status: DC

## 2014-04-19 NOTE — ED Notes (Signed)
Patient transported to X-ray 

## 2014-04-19 NOTE — ED Notes (Signed)
Pt requesting detox from etoh, last drank today. Denies any SI or HI.  

## 2014-04-19 NOTE — ED Provider Notes (Signed)
CSN: [id number]     Arrival date & time 04/19/14  1754 History   First MD Initiated Contact with Patient 04/19/14 1912     Chief Complaint  Patient presents with  . Alcohol Problem  . Medical Clearance     (Consider location/radiation/quality/duration/timing/severity/associated sxs/prior Treatment) The history is provided by the patient and medical records. No language interpreter was used.     [name] is a 38 y.o. female  with a hx of HTN, EtOH abuse presents to the Emergency Department requesting detox. Patient reports her last drink was today which included two 40 ounce beers.  She reports she completed alcohol detox in 2012 but has been drinking again for a number of years. Patient reports she smokes a half pack of cigarettes per day but denies drug use. Patient denies suicidal or homicidal ideations, auditory or visual hallucinations.  Patient does complain of central, burning chest pain intermittently for the last several years. This episode began approximately 1 week ago and has been constant and persistent. No aggravating or alleviating factors. Patient's pain does not worsen with exertion and she denies syncope, near syncope, fevers, chills, diaphoresis and shortness of breath. Patient reports she takes multiple BC powders per day and at times reports epigastric abdominal pain in association with her burning chest pain.  Patient denies abdominal pain today.     History reviewed. No pertinent past medical history. Past Surgical History  Procedure Laterality Date  . Leg surgery    . Left wrist injury Left 07/2012    states that she fell on snow and glass   Family History  Problem Relation Age of Onset  . Hypertension Father   . Cancer Paternal Grandfather    History  Substance Use Topics  . Smoking status: Current Some Day Smoker -- 0.50 packs/day for 15 years    Types: Cigarettes  . Smokeless tobacco: Not on file  . Alcohol Use: 4.8 oz/week    8 Cans of beer per  week     Comment: beer   OB History    Gravida Para Term Preterm AB TAB SAB Ectopic Multiple Living   3 2 2       2      Review of Systems  Constitutional: Negative for fever, diaphoresis, appetite change, fatigue and unexpected weight change.  HENT: Negative for mouth sores.   Eyes: Negative for visual disturbance.  Respiratory: Negative for cough, chest tightness, shortness of breath and wheezing.   Cardiovascular: Positive for chest pain.  Gastrointestinal: Negative for nausea, vomiting, abdominal pain, diarrhea and constipation.  Endocrine: Negative for polydipsia, polyphagia and polyuria.  Genitourinary: Negative for dysuria, urgency, frequency and hematuria.  Musculoskeletal: Negative for back pain and neck stiffness.  Skin: Negative for rash.  Allergic/Immunologic: Negative for immunocompromised state.  Neurological: Negative for syncope, light-headedness and headaches.  Hematological: Does not bruise/bleed easily.  Psychiatric/Behavioral: Negative for sleep disturbance. The patient is not nervous/anxious.       Allergies  Review of patient's allergies indicates no known allergies.  Home Medications   Prior to Admission medications   Medication Sig Start Date End Date Taking? Authorizing Provider  acetaminophen (TYLENOL) 325 MG tablet Take 650 mg by mouth every 6 (six) hours as needed for mild pain.    Yes Historical Provider, MD  ibuprofen (ADVIL,MOTRIN) 800 MG tablet Take 1 tablet (800 mg total) by mouth 3 (three) times daily. 06/04/13  Yes Lonia SkinnerLeslie K Sofia, PA-C  benzonatate (TESSALON) 100 MG capsule Take 1 capsule (  100 mg total) by mouth every 8 (eight) hours. Patient not taking: Reported on 04/19/2014 06/04/13   Elson AreasLeslie K Sofia, PA-C   BP 108/62 mmHg  Pulse 90  Temp(Src) 98.3 F (36.8 C) (Oral)  Resp 21  Ht 5\' 7"  (1.702 m)  Wt 150 lb (68.04 kg)  BMI 23.49 kg/m2  SpO2 94%  LMP 04/19/2014 Physical Exam  Constitutional: She appears well-developed and well-nourished.  No distress.  Awake, alert, nontoxic appearance  HENT:  Head: Normocephalic and atraumatic.  Mouth/Throat: Oropharynx is clear and moist. No oropharyngeal exudate.  Eyes: Conjunctivae are normal. No scleral icterus.  Neck: Normal range of motion. Neck supple.  Cardiovascular: Regular rhythm, normal heart sounds and intact distal pulses.   No murmur heard. Tachycardia  Pulmonary/Chest: Effort normal and breath sounds normal. No respiratory distress. She has no wheezes.  Equal chest expansion Clear and equal breath sounds  Abdominal: Soft. Bowel sounds are normal. She exhibits no distension and no mass. There is no tenderness. There is no rebound and no guarding.  Abdomen soft and nontender  Musculoskeletal: Normal range of motion. She exhibits no edema.  Neurological: She is alert. She exhibits normal muscle tone. Coordination normal.  Speech is clear and goal oriented Moves extremities without ataxia  Skin: Skin is warm and dry. She is not diaphoretic. No erythema.  Psychiatric: She has a normal mood and affect.  Nursing note and vitals reviewed.   ED Course  Procedures (including critical care time) Labs Review Labs Reviewed  CBC - Abnormal; Notable for the following:    Hemoglobin 11.8 (*)    HCT 35.9 (*)    All other components within normal limits  COMPREHENSIVE METABOLIC PANEL - Abnormal; Notable for the following:    AST 70 (*)    ALT 65 (*)    Total Bilirubin <0.2 (*)    Anion gap 16 (*)    All other components within normal limits  ETHANOL - Abnormal; Notable for the following:    Alcohol, Ethyl (B) 366 (*)    All other components within normal limits  SALICYLATE LEVEL - Abnormal; Notable for the following:    Salicylate Lvl <2.0 (*)    All other components within normal limits  URINALYSIS, ROUTINE W REFLEX MICROSCOPIC - Abnormal; Notable for the following:    APPearance HAZY (*)    Hgb urine dipstick LARGE (*)    All other components within normal limits   URINE MICROSCOPIC-ADD ON - Abnormal; Notable for the following:    Squamous Epithelial / LPF MANY (*)    Bacteria, UA FEW (*)    All other components within normal limits  ETHANOL - Abnormal; Notable for the following:    Alcohol, Ethyl (B) 326 (*)    All other components within normal limits  ACETAMINOPHEN LEVEL  URINE RAPID DRUG SCREEN (HOSP PERFORMED)  TROPONIN I  LIPASE, BLOOD  I-STAT BETA HCG BLOOD, ED ([facility], [facility], [facility] ONLY)    Imaging Review Dg Chest 2 View  04/19/2014   CLINICAL DATA:  Initial evaluation. Medical clearance. Cough for past 2 days.  EXAM: CHEST  2 VIEW  COMPARISON:  Prior radiograph from 12/24/2009  FINDINGS: The cardiac and mediastinal silhouettes are stable in size and contour, and remain within normal limits.  The lungs are normally inflated. No airspace consolidation, pleural effusion, or pulmonary edema is identified. There is no pneumothorax.  No acute osseous abnormality identified.  IMPRESSION: No active cardiopulmonary disease.   Electronically Signed   By: Sharlet SalinaBenjamin  Phill Myron M.D.   On: 04/19/2014 20:40     EKG Interpretation   Date/Time:  Friday April 19 2014 19:24:52 EST Ventricular Rate:  99 PR Interval:  164 QRS Duration: 81 QT Interval:  352 QTC Calculation: 452 R Axis:   70 Text Interpretation:  Sinus rhythm Confirmed by ZAMMIT  MD, JOSEPH [phone])  on 04/19/2014 7:29:32 PM      MDM   Final diagnoses:  ETOH abuse    [name] presents for alcohol detox but also with complaints of chest pain and risk factors of persistent NSAID usage. Patient with tachycardia and significantly elevated alcohol at 366.  Labs otherwise reassuring. Will obtain cardiac and abdominal workup. Patient will receive a fluid bolus and we will reassess prior to TTS consult.  12:30 AM Patient labs reassuring. EKG without ischemia. Patient with complete resolution of chest pain after Protonix administration. Likely due to NSAID and alcohol induced gastritis.  Patient has been sleeping off and on and continues to sober. Will consult TTS.  BP 108/62 mmHg  Pulse 90  Temp(Src) 98.3 F (36.8 C) (Oral)  Resp 21  Ht 5\' 7"  (1.702 m)  Wt 150 lb (68.04 kg)  BMI 23.49 kg/m2  SpO2 94%  LMP 04/19/2014   Dierdre Forth, PA-C 04/20/14 0031  Benny Lennert, MD 04/20/14 [phone]

## 2014-04-20 ENCOUNTER — Encounter (HOSPITAL_COMMUNITY): Payer: Self-pay | Admitting: *Deleted

## 2014-04-20 ENCOUNTER — Emergency Department (HOSPITAL_COMMUNITY)
Admission: EM | Admit: 2014-04-20 | Discharge: 2014-04-20 | Disposition: A | Discharge status: Home / Self Care | Payer: Medicaid Other | Source: Home / Self Care | Attending: Emergency Medicine | Admitting: Emergency Medicine

## 2014-04-20 DIAGNOSIS — F1010 Alcohol abuse, uncomplicated: Secondary | ICD-10-CM | POA: Insufficient documentation

## 2014-04-20 DIAGNOSIS — Z720 Tobacco use: Secondary | ICD-10-CM | POA: Insufficient documentation

## 2014-04-20 DIAGNOSIS — Z791 Long term (current) use of non-steroidal anti-inflammatories (NSAID): Secondary | ICD-10-CM

## 2014-04-20 LAB — PREGNANCY, URINE: Preg Test, Ur: NEGATIVE

## 2014-04-20 LAB — ETHANOL: Alcohol, Ethyl (B): 61 mg/dL — ABNORMAL HIGH (ref 0–11)

## 2014-04-20 MED ORDER — IBUPROFEN 400 MG PO TABS: 600.0000 mg | ORAL_TABLET | Freq: Three times a day (TID) | ORAL | Status: DC | PRN

## 2014-04-20 MED ORDER — NICOTINE 21 MG/24HR TD PT24: 21.0000 mg | MEDICATED_PATCH | Freq: Every day | TRANSDERMAL | Status: DC

## 2014-04-20 MED ORDER — ZOLPIDEM TARTRATE 5 MG PO TABS: 5.0000 mg | ORAL_TABLET | Freq: Every evening | ORAL | Status: DC | PRN

## 2014-04-20 MED ORDER — LORAZEPAM 1 MG PO TABS: 1.0000 mg | ORAL_TABLET | Freq: Three times a day (TID) | ORAL | Status: DC | PRN

## 2014-04-20 MED ORDER — ALUM & MAG HYDROXIDE-SIMETH 200-200-20 MG/5ML PO SUSP: 30.0000 mL | ORAL | Status: DC | PRN

## 2014-04-20 MED ORDER — ONDANSETRON HCL 4 MG PO TABS: 4.0000 mg | ORAL_TABLET | Freq: Three times a day (TID) | ORAL | Status: DC | PRN

## 2014-04-20 NOTE — Discharge Instructions (Signed)
Please utilize the alcohol and substance abuse resources provided below.  RESOURCE GUIDE  Chronic Pain Problems: Contact [facility]  [phone] Patients need to be referred by their primary care doctor.  Insufficient Money for Medicine: Contact United Way:  call "211."   No Primary Care Doctor: - Call Health Connect  [phone] - can help you locate a primary care doctor that  accepts your insurance, provides certain services, etc. - Physician Referral Service- [phone]  Agencies that provide inexpensive medical care: - [facility]  [phone] - [facility]  [phone] - [facility]  [phone] - Women's Clinic  [phone] - Planned Parenthood  [phone] Haynes Bast[facility]  [phone]  Medicaid-accepting [facility] Providers: - [facility]- [address], [address]  [phone], Mon-Fri 9am-7pm, Sat 9am-1pm - [facility]- [address], [address] [place]  [phone] - [facility]- [address], [address] [place]  [phone] [facility]- [address] [place] [address]  [phone] - Renaye RakersVeita Bland- [address], [address], [phone]  Only accepts [place] patients after they have their name  applied to their card  Self Pay (no insurance) in [place]: - Sickle Cell Patients: Dr Willey BladeEric Dean, [facility]  [address], [phone] - [facility]- [address]  [phone]       Redge Gainer-     [facility] [place]- [address], [address]       -     [facility]- see information above (Speak to CitigroupPam H if you do not have insurance)       -  [facility] [place]- [address],  [phone]       -  [facility]- [address] [place] [address], [phone]       -  Dr Julio Sickssei-Bonsu-  [address], [address], [place], [phone]       -  [facility] - [address], [phone]       -  [facility] [place]- [address] [place] [address], [phone], also [address], [phone]       -    [facility]- [address], [phone], 1st & 3rd Saturday        every month, 10am-1pm  [facility] [address] [place], [place] [address] [phone]  [facility] [address] [place], [place] [address] [phone]  1) Find a Doctor and Pay Out of Pocket Although you won't have to find out who is covered by your insurance plan, it is a good idea to ask around and get recommendations. You will then need to call the office and see if the doctor you have chosen will accept you as a new patient and what types of options they offer for patients who are self-pay. Some doctors offer discounts or will set up payment plans for their patients who do not have insurance, but you will need to ask so you aren't surprised when you get to your appointment.  2) Contact Your Local Health Department Not all health departments have doctors that can see patients for sick visits, but many do, so it is worth a call to see if yours does. If you don't know where your local health department is, you can  check in your phone book. The CDC also has a tool to help you locate your state's health department, and many state websites also have listings of all of their local health departments.  3) Find a Walk-in Clinic If your illness is not likely to be very severe or complicated, you may want to try a walk in clinic. These are popping up all over the country in pharmacies, drugstores, and shopping centers. They're usually staffed by nurse practitioners or physician assistants that have been trained to treat common illnesses and complaints. They're usually fairly quick and inexpensive. However, if you have serious medical issues or chronic medical problems, these are probably not your best option  STD Testing - [facility]  Department of [facility] [place], STD Clinic, [address], [place], phone [phone] or [phone].  Monday - Friday, call for an appointment. [facility]- [place] Department of [facility] [place], STD Clinic, [address], [place], phone [phone] or [phone].  Monday - Friday, call for an appointment.  Abuse/Neglect: [facility]- [place] Child Abuse Hotline [phone] [facility]- [place] Child Abuse Hotline [phone] (After Hours)  Emergency Shelter:  [facility] [phone]  Maternity Homes: - [facility] [phone] - [facility] [phone]  MRSA Hotline #:   408-705-2329713 651 3509  Dental Assistance If unable to pay or uninsured, contact:  [facility] to become qualified for the adult dental clinic.  Patients with Medicaid: [facility] [phone], [phone] [address], [phone]  If unable to pay, or uninsured, contact [facility] [phone] in [place], [phone] in [facility]) to become qualified for the adult dental clinic  [facility] [address] [place], [place] [address] [phone] [email or web address]  Other ProofreaderLow-Cost Community Dental Services: - [facility]- [address], [place], [place], [address], [phone], Ext. [phone], 2nd and 4th Thursday of the month at 6:30am.  10 clients each day by appointment, can sometimes see walk-in patients if someone does not show for an appointment. [facility]- [address], [place], [place], [address], [phone] - [facility]- [address], [place], [place], [address], [phone] - [facility]- [phone] [facility]- [facility]- [phone] [facility]- [facility]- [phone]

## 2014-04-20 NOTE — ED Notes (Signed)
TTS consult completed 

## 2014-04-20 NOTE — Progress Notes (Signed)
MHT spoke with Lura EmPatsy, RN at [facility] who accepted pt.  Labs are good that were faxed to [phone] for pregnancy and BAC.  Blain PaisMichelle L Cartina Brousseau, MHT/NS

## 2014-04-20 NOTE — Progress Notes (Signed)
MHT spoke with Patsy, RN at [facility].  Pregnancy test and update labs when complete should be faxed to [phone].  RN report#[phone] to be transported via [facility] once [facility] ok[facility] labs and pt has no chest pain upon discharge.  Blain PaisMichelle L Mardelle Pandolfi, MHT/NS

## 2014-04-20 NOTE — Progress Notes (Signed)
Clinician received call from MacyNicole at [facility] who inquired about accepting patient.  [facility] on [facility]. Clinician was informed patient was [facility]. Clinician contacted Joni ReiningNicole at [facility] with updates.  Nira Retortelilah Dickie Cloe, MSW, LCSW Triage Specialist [phone]
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
## 2014-04-20 NOTE — ED Notes (Signed)
Inventory of pt's belongings completed with Tenna DelaineKaren Newman, CN. Belongings taken to safe and the container room.

## 2014-04-20 NOTE — Progress Notes (Signed)
Patsy, RN at [facility] accepted pt to Dr.Head if BAL level below 200 upon redraw this AM.  [facility].  Blain PaisMichelle L Eiliana Drone, MHT/NS

## 2014-04-20 NOTE — ED Notes (Signed)
Dr Rhunette CroftNanavati in w/pt.

## 2014-04-20 NOTE — Progress Notes (Signed)
MHT contacted the following facilities for placement:  [facility] 1)[facility] 2)[facility]  DECLINED 1)[facility]-cant accept Medicaid  FAXED REFERRAL 1)[facility]  Blain PaisMichelle L Kollins Fenter, MHT/NS

## 2014-04-20 NOTE — ED Provider Notes (Signed)
Patient here for alcohol detox. Accepted at [facility] under Dr. [place] PinkHead. Stable for transfer.   Richardean Canalavid H Yao, MD 04/20/14 [phone]

## 2014-04-20 NOTE — BH Assessment (Signed)
Received call for assessment. Spoke [name], [name] who said Pt is an alcohol and is requesting detox. She has been medically cleared. Tele-assessment will be initiated.  Harlin RainFord Ellis Ria CommentWarrick Jr, LPC, [facility] Triage Specialist [phone]

## 2014-04-20 NOTE — ED Notes (Signed)
PT D/C'D TO [facility]. [facility] TRANSPORTED PT. [facility] CALLED AND ADVISED PT REFUSING TO GO TO [facility] AND [facility] BROUGHT PT BACK TO ED W/HER BELONGINGS.

## 2014-04-22 NOTE — ED Provider Notes (Signed)
CSN: [id number]     Arrival date & time 04/20/14  [id number] History   First MD Initiated Contact with Patient 04/20/14 [phone]     Chief Complaint  Patient presents with  . Medical Clearance     (Consider location/radiation/quality/duration/timing/severity/associated sxs/prior Treatment) HPI Comments: Pt had come to the ER with cc of alcohol detox. Pt was [facility] to [facility]. Pt was on her way to [facility], and on the way - she changed her mind, and wanted [place]. Thus pt was driven back to [facility]. She has no new complains. She wants to stay near [place], as all her family is near here, and she wants them to be able to visit her.  The history is provided by the patient.    History reviewed. No pertinent past medical history. Past Surgical History  Procedure Laterality Date  . Leg surgery    . Left wrist injury Left 07/2012    states that she fell on snow and glass   Family History  Problem Relation Age of Onset  . Hypertension Father   . Cancer Paternal Grandfather    History  Substance Use Topics  . Smoking status: Current Some Day Smoker -- 0.50 packs/day for 15 years    Types: Cigarettes  . Smokeless tobacco: Not on file  . Alcohol Use: 4.8 oz/week    8 Cans of beer per week     Comment: beer   OB History    Gravida Para Term Preterm AB TAB SAB Ectopic Multiple Living   3 2 2       2      Review of Systems  Constitutional: Negative for activity change.  Psychiatric/Behavioral: Negative for suicidal ideas. The patient is not nervous/anxious.       Allergies  Review of patient's allergies indicates no known allergies.  Home Medications   Prior to Admission medications   Medication Sig Start Date End Date Taking? Authorizing Provider  acetaminophen (TYLENOL) 325 MG tablet Take 650 mg by mouth every 6 (six) hours as needed for mild pain.     Historical Provider, MD  benzonatate (TESSALON) 100 MG capsule Take 1 capsule (100 mg total) by mouth every 8  (eight) hours. Patient not taking: Reported on 04/19/2014 06/04/13   Elson AreasLeslie K Sofia, PA-C  ibuprofen (ADVIL,MOTRIN) 800 MG tablet Take 1 tablet (800 mg total) by mouth 3 (three) times daily. 06/04/13   Lonia SkinnerLeslie K Sofia, PA-C   BP 120/81 mmHg  Pulse 84  Temp(Src) 97.7 F (36.5 C) (Oral)  Resp 18  SpO2 100%  LMP 04/19/2014 Physical Exam  Constitutional: She is oriented to person, place, and time. She appears well-developed.  Eyes: Conjunctivae are normal.  Neck: Neck supple.  Pulmonary/Chest: Effort normal.  Neurological: She is alert and oriented to person, place, and time.  Psychiatric: She has a normal mood and affect. Her behavior is normal. Judgment and thought content normal.  Nursing note and vitals reviewed.   ED Course  Procedures (including critical care time) Labs Review Labs Reviewed - No data to display  Imaging Review No results found.   EKG Interpretation None      MDM   Final diagnoses:  Alcohol abuse   Pt with alcoholism wants alcohol detox. She was discharged to a facility, and doesnt want to go that far. TTS has no further options. Pt is stable for discharge.  She was given local resources.   Derwood KaplanAnkit Jamis Kryder, MD 04/22/14 1351

## 2014-05-14 ENCOUNTER — Emergency Department (HOSPITAL_COMMUNITY)
Admission: EM | Admit: 2014-05-14 | Discharge: 2014-05-14 | Discharge status: Against Medical Advice | Payer: Medicaid Other | Attending: Emergency Medicine | Admitting: Emergency Medicine

## 2014-05-14 ENCOUNTER — Encounter (HOSPITAL_COMMUNITY): Payer: Self-pay | Admitting: Emergency Medicine

## 2014-05-14 DIAGNOSIS — F1010 Alcohol abuse, uncomplicated: Secondary | ICD-10-CM | POA: Insufficient documentation

## 2014-05-14 DIAGNOSIS — Z720 Tobacco use: Secondary | ICD-10-CM | POA: Insufficient documentation
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
## 2014-05-14 NOTE — ED Notes (Signed)
Called twice for patient. Looked outside. No evidence of patient on property.

## 2014-05-15 ENCOUNTER — Encounter (HOSPITAL_COMMUNITY): Payer: Self-pay | Admitting: Emergency Medicine

## 2014-05-15 ENCOUNTER — Emergency Department (HOSPITAL_COMMUNITY)
Admission: EM | Admit: 2014-05-15 | Discharge: 2014-05-15 | Discharge status: Against Medical Advice | Payer: Medicaid Other | Attending: Emergency Medicine | Admitting: Emergency Medicine

## 2014-05-15 DIAGNOSIS — F1010 Alcohol abuse, uncomplicated: Secondary | ICD-10-CM | POA: Insufficient documentation

## 2014-05-15 DIAGNOSIS — Z720 Tobacco use: Secondary | ICD-10-CM | POA: Insufficient documentation

## 2014-05-15 HISTORY — DX: Depression, unspecified: F32.A

## 2014-05-15 HISTORY — DX: Major depressive disorder, single episode, unspecified: F32.9

## 2014-05-15 HISTORY — DX: Alcohol abuse, uncomplicated: F10.10

## 2014-05-15 LAB — CBC WITH DIFFERENTIAL/PLATELET
BASOS PCT: 2 % — AB (ref 0–1)
Basophils Absolute: 0.1 10*3/uL (ref 0.0–0.1)
EOS PCT: 1 % (ref 0–5)
Eosinophils Absolute: 0.1 10*3/uL (ref 0.0–0.7)
HEMATOCRIT: 35.5 % — AB (ref 36.0–46.0)
Hemoglobin: 12.1 g/dL (ref 12.0–15.0)
Lymphocytes Relative: 41 % (ref 12–46)
Lymphs Abs: 2.3 10*3/uL (ref 0.7–4.0)
MCH: 28.9 pg (ref 26.0–34.0)
MCHC: 34.1 g/dL (ref 30.0–36.0)
MCV: 84.9 fL (ref 78.0–100.0)
MONO ABS: 0.4 10*3/uL (ref 0.1–1.0)
Monocytes Relative: 6 % (ref 3–12)
NEUTROS ABS: 2.8 10*3/uL (ref 1.7–7.7)
Neutrophils Relative %: 50 % (ref 43–77)
Platelets: 244 10*3/uL (ref 150–400)
RBC: 4.18 MIL/uL (ref 3.87–5.11)
RDW: 15.4 % (ref 11.5–15.5)
WBC: 5.6 10*3/uL (ref 4.0–10.5)

## 2014-05-15 LAB — COMPREHENSIVE METABOLIC PANEL
ALBUMIN: 4.4 g/dL (ref 3.5–5.2)
ALT: 89 U/L — ABNORMAL HIGH (ref 0–35)
ANION GAP: 17 — AB (ref 5–15)
AST: 112 U/L — ABNORMAL HIGH (ref 0–37)
Alkaline Phosphatase: 70 U/L (ref 39–117)
BUN: 7 mg/dL (ref 6–23)
CALCIUM: 9 mg/dL (ref 8.4–10.5)
CO2: 21 mEq/L (ref 19–32)
CREATININE: 0.48 mg/dL — AB (ref 0.50–1.10)
Chloride: 96 mEq/L (ref 96–112)
GFR calc Af Amer: >90 mL/min (ref >90)
GFR calc non Af Amer: >90 mL/min (ref >90)
Glucose, Bld: 73 mg/dL (ref 70–99)
Potassium: 4.1 mEq/L (ref 3.7–5.3)
Sodium: 134 mEq/L — ABNORMAL LOW (ref 137–147)
Total Bilirubin: 0.2 mg/dL — ABNORMAL LOW (ref 0.3–1.2)
Total Protein: 8.3 g/dL (ref 6.0–8.3)

## 2014-05-15 LAB — ETHANOL: ALCOHOL ETHYL (B): 345 mg/dL — AB (ref 0–11)

## 2014-05-15 NOTE — ED Notes (Signed)
Pt. requesting detox from alcohol abuse last drink today , denies suicidal ideation, no hallucinations.

## 2014-05-15 NOTE — ED Notes (Signed)
Pt states that she does not want to stay because her boyfriend is not allowed to stay and she cannot have her bookbag in the room. Explained to pt that this is our policy, explained that we can evaluate her. Pt refuses this. Pt given resource guide. Refused to sign out. Pt ambulatory and in nad upon walking out of department.

## 2014-05-15 NOTE — ED Notes (Signed)
Pt states that she drank 3 40oz beers, last drink 1.5 hrs ago. Requesting detox from alcohol. Denies SI.

## 2014-06-14 ENCOUNTER — Emergency Department (HOSPITAL_COMMUNITY)
Admission: EM | Admit: 2014-06-14 | Discharge: 2014-06-14 | Discharge status: Against Medical Advice | Payer: Medicaid Other | Attending: Emergency Medicine | Admitting: Emergency Medicine

## 2014-06-14 ENCOUNTER — Encounter (HOSPITAL_COMMUNITY): Payer: Self-pay | Admitting: *Deleted

## 2014-06-14 DIAGNOSIS — F1020 Alcohol dependence, uncomplicated: Secondary | ICD-10-CM

## 2014-06-14 DIAGNOSIS — Z008 Encounter for other general examination: Secondary | ICD-10-CM | POA: Insufficient documentation

## 2014-06-14 DIAGNOSIS — F10120 Alcohol abuse with intoxication, uncomplicated: Secondary | ICD-10-CM | POA: Insufficient documentation

## 2014-06-14 DIAGNOSIS — Z8659 Personal history of other mental and behavioral disorders: Secondary | ICD-10-CM | POA: Insufficient documentation

## 2014-06-14 DIAGNOSIS — Z720 Tobacco use: Secondary | ICD-10-CM | POA: Insufficient documentation

## 2014-06-14 LAB — CBC
HEMATOCRIT: 37.3 % (ref 36.0–46.0)
Hemoglobin: 12.5 g/dL (ref 12.0–15.0)
MCH: 29.8 pg (ref 26.0–34.0)
MCHC: 33.5 g/dL (ref 30.0–36.0)
MCV: 89 fL (ref 78.0–100.0)
PLATELETS: 254 10*3/uL (ref 150–400)
RBC: 4.19 MIL/uL (ref 3.87–5.11)
RDW: 16.3 % — ABNORMAL HIGH (ref 11.5–15.5)
WBC: 4.3 10*3/uL (ref 4.0–10.5)

## 2014-06-14 LAB — COMPREHENSIVE METABOLIC PANEL
ALBUMIN: 4.2 g/dL (ref 3.5–5.2)
ALK PHOS: 56 U/L (ref 39–117)
ALT: 73 U/L — ABNORMAL HIGH (ref 0–35)
ANION GAP: 10 (ref 5–15)
AST: 123 U/L — ABNORMAL HIGH (ref 0–37)
BUN: 7 mg/dL (ref 6–23)
CHLORIDE: 107 meq/L (ref 96–112)
CO2: 23 mmol/L (ref 19–32)
Calcium: 8.9 mg/dL (ref 8.4–10.5)
Creatinine, Ser: 0.53 mg/dL (ref 0.50–1.10)
GFR calc Af Amer: >90 mL/min (ref >90)
Glucose, Bld: 82 mg/dL (ref 70–99)
Potassium: 3.7 mmol/L (ref 3.5–5.1)
SODIUM: 140 mmol/L (ref 135–145)
Total Bilirubin: <0.1 mg/dL — ABNORMAL LOW (ref 0.3–1.2)
Total Protein: 8 g/dL (ref 6.0–8.3)

## 2014-06-14 LAB — ACETAMINOPHEN LEVEL: Acetaminophen (Tylenol), Serum: <10 ug/mL — ABNORMAL LOW (ref 10–30)

## 2014-06-14 LAB — ETHANOL: ALCOHOL ETHYL (B): 428 mg/dL — AB (ref 0–9)

## 2014-06-14 LAB — SALICYLATE LEVEL: Salicylate Lvl: <4 mg/dL (ref 2.8–20.0)

## 2014-06-14 NOTE — ED Notes (Signed)
Dr. Gwendolyn GrantWalden at bedside, attempting to assess pt.  Pt yelling and noncompliant with care.

## 2014-06-14 NOTE — ED Provider Notes (Signed)
CSN: [id number]     Arrival date & time 06/14/14  1453 History   First MD Initiated Contact with Patient 06/14/14 1550     Chief Complaint  Patient presents with  . Alcohol Problem  . Medical Clearance     (Consider location/radiation/quality/duration/timing/severity/associated sxs/prior Treatment) Patient is a 39 y.o. female presenting with alcohol problem. The history is provided by the patient.  Alcohol Problem This is a chronic problem. The current episode started more than 1 week ago. The problem occurs constantly. The problem has not changed since onset.Pertinent negatives include no chest pain and no abdominal pain. Nothing aggravates the symptoms. Nothing relieves the symptoms. She has tried nothing for the symptoms.    Past Medical History  Diagnosis Date  . Depression   . Alcohol abuse    Past Surgical History  Procedure Laterality Date  . Leg surgery    . Left wrist injury Left 07/2012    states that she fell on snow and glass   Family History  Problem Relation Age of Onset  . Hypertension Father   . Cancer Paternal Grandfather    History  Substance Use Topics  . Smoking status: Current Some Day Smoker -- 0.50 packs/day for 15 years    Types: Cigarettes  . Smokeless tobacco: Not on file  . Alcohol Use: 4.8 oz/week    8 Cans of beer per week     Comment: beer   OB History    Gravida Para Term Preterm AB TAB SAB Ectopic Multiple Living   3 2 2       2      Review of Systems  Unable to perform ROS: Acuity of condition  Cardiovascular: Negative for chest pain.  Gastrointestinal: Negative for abdominal pain.      Allergies  Review of patient's allergies indicates no known allergies.  Home Medications   Prior to Admission medications   Medication Sig Start Date End Date Taking? Authorizing Provider  benzonatate (TESSALON) 100 MG capsule Take 1 capsule (100 mg total) by mouth every 8 (eight) hours. Patient not taking: Reported on 05/15/2014 06/04/13    Elson AreasLeslie K Sofia, PA-C  ibuprofen (ADVIL,MOTRIN) 800 MG tablet Take 1 tablet (800 mg total) by mouth 3 (three) times daily. Patient not taking: Reported on 05/15/2014 06/04/13   Elson AreasLeslie K Sofia, PA-C   BP 107/60 mmHg  Pulse 93  Temp(Src) 97.8 F (36.6 C) (Oral)  Resp 18  SpO2 98% Physical Exam  Constitutional: She is oriented to person, place, and time. She appears well-developed and well-nourished. No distress.  Intoxicated  HENT:  Head: Normocephalic and atraumatic.  Mouth/Throat: Oropharynx is clear and moist.  Eyes: EOM are normal. Pupils are equal, round, and reactive to light.  Neck: Normal range of motion. Neck supple.  Cardiovascular: Normal rate and regular rhythm.  Exam reveals no friction rub.   No murmur heard. Pulmonary/Chest: Effort normal and breath sounds normal. No respiratory distress. She has no wheezes. She has no rales.  Abdominal: Soft. She exhibits no distension. There is no tenderness. There is no rebound.  Musculoskeletal: Normal range of motion. She exhibits no edema.  Neurological: She is alert and oriented to person, place, and time. No cranial nerve deficit. She exhibits normal muscle tone. Coordination normal.  Skin: No rash noted. She is not diaphoretic.  Nursing note and vitals reviewed.   ED Course  Procedures (including critical care time) Labs Review Labs Reviewed  ACETAMINOPHEN LEVEL  CBC  COMPREHENSIVE METABOLIC PANEL  ETHANOL  SALICYLATE LEVEL  URINE RAPID DRUG SCREEN (HOSP PERFORMED)    Imaging Review No results found.   EKG Interpretation None      MDM   Final diagnoses:  None    39 year old female here intoxicated. She when asked why she is here she states, "my fucking alcoholism". She has been in rehabilitation before and wants detox again. No history of seizures per the patient with detox. I informed patient that we cannot offer inpatient detox today due to limited resources but we can refer her to other places for detox. She  is not suicidal or homicidal. She then grabbed her bag and walked out. She refused any further care, discharge instructions.  Boyfriend given list of resources, instructed to go to [facility], they are open 24/7.   Elwin Mocha, MD 06/14/14 [phone]

## 2014-06-14 NOTE — ED Notes (Signed)
Pt left Pod C with security en route after being told that there were no detox beds available.

## 2014-06-14 NOTE — ED Notes (Signed)
Pt reports +etoh use today and pt is requesting detox. Denies any SI or HI.

## 2014-06-14 NOTE — ED Notes (Signed)
During assessment with Dr. Gwendolyn GrantWalden, pt heavily intoxicated but denied being SI/HI.

## 2014-06-19 ENCOUNTER — Encounter (HOSPITAL_COMMUNITY): Payer: Self-pay | Admitting: Emergency Medicine

## 2014-06-19 ENCOUNTER — Emergency Department (HOSPITAL_COMMUNITY)
Admission: EM | Admit: 2014-06-19 | Discharge: 2014-06-20 | Disposition: A | Discharge status: Home / Self Care | Payer: Medicaid Other | Attending: Emergency Medicine | Admitting: Emergency Medicine

## 2014-06-19 DIAGNOSIS — Z720 Tobacco use: Secondary | ICD-10-CM | POA: Insufficient documentation

## 2014-06-19 DIAGNOSIS — Z3202 Encounter for pregnancy test, result negative: Secondary | ICD-10-CM | POA: Insufficient documentation

## 2014-06-19 DIAGNOSIS — F1010 Alcohol abuse, uncomplicated: Secondary | ICD-10-CM

## 2014-06-19 DIAGNOSIS — Z8659 Personal history of other mental and behavioral disorders: Secondary | ICD-10-CM | POA: Insufficient documentation

## 2014-06-19 LAB — CBC
HEMATOCRIT: 34.8 % — AB (ref 36.0–46.0)
HEMOGLOBIN: 11.8 g/dL — AB (ref 12.0–15.0)
MCH: 29.5 pg (ref 26.0–34.0)
MCHC: 33.9 g/dL (ref 30.0–36.0)
MCV: 87 fL (ref 78.0–100.0)
Platelets: 236 10*3/uL (ref 150–400)
RBC: 4 MIL/uL (ref 3.87–5.11)
RDW: 16.4 % — AB (ref 11.5–15.5)
WBC: 5.4 10*3/uL (ref 4.0–10.5)

## 2014-06-19 LAB — COMPREHENSIVE METABOLIC PANEL
ALK PHOS: 57 U/L (ref 39–117)
ALT: 90 U/L — AB (ref 0–35)
AST: 133 U/L — AB (ref 0–37)
Albumin: 4.4 g/dL (ref 3.5–5.2)
Anion gap: 7 (ref 5–15)
BILIRUBIN TOTAL: 0.4 mg/dL (ref 0.3–1.2)
BUN: 6 mg/dL (ref 6–23)
CO2: 30 mmol/L (ref 19–32)
Calcium: 8.9 mg/dL (ref 8.4–10.5)
Chloride: 100 mEq/L (ref 96–112)
Creatinine, Ser: 0.54 mg/dL (ref 0.50–1.10)
GFR calc Af Amer: >90 mL/min (ref >90)
GLUCOSE: 86 mg/dL (ref 70–99)
Potassium: 4 mmol/L (ref 3.5–5.1)
Sodium: 137 mmol/L (ref 135–145)
Total Protein: 7.7 g/dL (ref 6.0–8.3)

## 2014-06-19 LAB — ETHANOL: Alcohol, Ethyl (B): 326 mg/dL — ABNORMAL HIGH (ref 0–9)

## 2014-06-19 LAB — POC URINE PREG, ED: PREG TEST UR: NEGATIVE

## 2014-06-19 LAB — RAPID URINE DRUG SCREEN, HOSP PERFORMED
AMPHETAMINES: NOT DETECTED
Barbiturates: NOT DETECTED
Benzodiazepines: NOT DETECTED
Cocaine: NOT DETECTED
OPIATES: NOT DETECTED
Tetrahydrocannabinol: NOT DETECTED

## 2014-06-19 MED ORDER — VITAMIN B-1 100 MG PO TABS
100.0000 mg | ORAL_TABLET | Freq: Every day | ORAL | Status: DC
  Filled 2014-06-19 (×2): qty 1

## 2014-06-19 MED ORDER — ONDANSETRON HCL 4 MG PO TABS
4.0000 mg | ORAL_TABLET | Freq: Three times a day (TID) | ORAL | Status: DC | PRN
Start: 2014-06-19 — End: 2014-06-20

## 2014-06-19 MED ORDER — IBUPROFEN 400 MG PO TABS
600.0000 mg | ORAL_TABLET | Freq: Three times a day (TID) | ORAL | Status: DC | PRN
  Administered 2014-06-19 – 2014-06-20 (×2): 600 mg via ORAL
  Filled 2014-06-19 (×4): qty 1

## 2014-06-19 MED ORDER — THIAMINE HCL 100 MG/ML IJ SOLN: 100.0000 mg | Freq: Every day | INTRAMUSCULAR | Status: DC

## 2014-06-19 MED ORDER — ZOLPIDEM TARTRATE 5 MG PO TABS: 5.0000 mg | ORAL_TABLET | Freq: Every evening | ORAL | Status: DC | PRN

## 2014-06-19 MED ORDER — ALUM & MAG HYDROXIDE-SIMETH 200-200-20 MG/5ML PO SUSP: 30.0000 mL | ORAL | Status: DC | PRN

## 2014-06-19 MED ORDER — VITAMIN B-1 100 MG PO TABS
100.0000 mg | ORAL_TABLET | Freq: Every day | ORAL | Status: DC
  Administered 2014-06-19 – 2014-06-20 (×2): 100 mg via ORAL

## 2014-06-19 MED ORDER — LORAZEPAM 1 MG PO TABS: 0.0000 mg | ORAL_TABLET | Freq: Two times a day (BID) | ORAL | Status: DC

## 2014-06-19 MED ORDER — LORAZEPAM 1 MG PO TABS
0.0000 mg | ORAL_TABLET | Freq: Four times a day (QID) | ORAL | Status: DC
  Administered 2014-06-20 (×2): 1 mg via ORAL
  Filled 2014-06-19 (×2): qty 1

## 2014-06-19 NOTE — ED Notes (Signed)
TTS in progress 

## 2014-06-19 NOTE — ED Notes (Signed)
The pt has been told that there are no behavorial beds available by the oiff-going rn.  That is the only place that the pt wants to go.  She does not want to go home.  She reports that she has no one to pick her up.
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
## 2014-06-19 NOTE — ED Notes (Signed)
Pt. requesting detox from alcohol abuse , last drink today , denies suicidal ideation / no hallucinations , no nausea / respirations unlabored.

## 2014-06-20 LAB — ETHANOL: Alcohol, Ethyl (B): <5 mg/dL (ref 0–9)

## 2014-06-20 NOTE — ED Notes (Signed)
Pt reports she is unsure if she wants to go to other facilities other than [facility]. "I need to think about it".

## 2014-06-20 NOTE — ED Notes (Signed)
Pt reporting that she has had lt shoulder pain for awhile and would like it xrayed.  Will wait for later am.  Will check fior pain med.  Will give with [phone] med

## 2014-06-20 NOTE — ED Notes (Signed)
Pt now reporting she will go [facility] for treatment. Will call BH disposition.

## 2014-06-20 NOTE — ED Notes (Signed)
Pt awake at this time, sitting at bedside to eat breakfast. Ambulatory to restroom.

## 2014-06-20 NOTE — ED Notes (Signed)
Pt and belongings departing with [facility] to [facility].

## 2014-06-20 NOTE — ED Notes (Signed)
Pt asleep.

## 2014-06-20 NOTE — ED Notes (Signed)
Lab at bedside

## 2014-06-20 NOTE — ED Notes (Addendum)
[facility] notified of need to transport pt to [facility] at 17:00. Pt belongings retrieved and reviewed with pt, acknowledged all belongings are present.

## 2014-06-20 NOTE — ED Notes (Signed)
Pt ambulatory to use phone is calm and cooperative.

## 2014-06-20 NOTE — ED Notes (Signed)
i woke the pt up ti give her pain med and her ativan.  She still has pain in that lt shoulder   Sleeping soundly

## 2014-06-20 NOTE — Discharge Instructions (Signed)
Alcohol Use Disorder °Alcohol use disorder is a mental disorder. It is not a one-time incident of heavy drinking. Alcohol use disorder is the excessive and uncontrollable use of alcohol over time that leads to problems with functioning in one or more areas of daily living. People with this disorder risk harming themselves and others when they drink to excess. Alcohol use disorder also can cause other mental disorders, such as mood and anxiety disorders, and serious physical problems. People with alcohol use disorder often misuse other drugs.  °Alcohol use disorder is common and widespread. Some people with this disorder drink alcohol to cope with or escape from negative life events. Others drink to relieve chronic pain or symptoms of mental illness. People with a family history of alcohol use disorder are at higher risk of losing control and using alcohol to excess.  °SYMPTOMS  °Signs and symptoms of alcohol use disorder may include the following:  °· Consumption of alcohol in larger amounts or over a longer period of time than intended. °· Multiple unsuccessful attempts to cut down or control alcohol use.   °· A great deal of time spent obtaining alcohol, using alcohol, or recovering from the effects of alcohol (hangover). °· A strong desire or urge to use alcohol (cravings).   °· Continued use of alcohol despite problems at work, school, or home because of alcohol use.   °· Continued use of alcohol despite problems in relationships because of alcohol use. °· Continued use of alcohol in situations when it is physically hazardous, such as driving a car. °· Continued use of alcohol despite awareness of a physical or psychological problem that is likely related to alcohol use. Physical problems related to alcohol use can involve the brain, heart, liver, stomach, and intestines. Psychological problems related to alcohol use include intoxication, depression, anxiety, psychosis, delirium, and dementia.   °· The need for  increased amounts of alcohol to achieve the same desired effect, or a decreased effect from the consumption of the same amount of alcohol (tolerance). °· Withdrawal symptoms upon reducing or stopping alcohol use, or alcohol use to reduce or avoid withdrawal symptoms. Withdrawal symptoms include: °¨ Racing heart. °¨ Hand tremor. °¨ Difficulty sleeping. °¨ Nausea. °¨ Vomiting. °¨ Hallucinations. °¨ Restlessness. °¨ Seizures. °DIAGNOSIS °Alcohol use disorder is diagnosed through an assessment by your health care provider. Your health care provider may start by asking three or four questions to screen for excessive or problematic alcohol use. To confirm a diagnosis of alcohol use disorder, at least two symptoms must be present within a 12-month period. The severity of alcohol use disorder depends on the number of symptoms: °· Mild--two or three. °· Moderate--four or five. °· Severe--six or more. °Your health care provider may perform a physical exam or use results from lab tests to see if you have physical problems resulting from alcohol use. Your health care provider may refer you to a mental health professional for evaluation. °TREATMENT  °Some people with alcohol use disorder are able to reduce their alcohol use to low-risk levels. Some people with alcohol use disorder need to quit drinking alcohol. When necessary, mental health professionals with specialized training in substance use treatment can help. Your health care provider can help you decide how severe your alcohol use disorder is and what type of treatment you need. The following forms of treatment are available:  °· Detoxification. Detoxification involves the use of prescription medicines to prevent alcohol withdrawal symptoms in the first week after quitting. This is important for people with a history of symptoms   of withdrawal and for heavy drinkers who are likely to have withdrawal symptoms. Alcohol withdrawal can be dangerous and, in severe cases, cause  death. Detoxification is usually provided in a hospital or in-patient substance use treatment facility. °· Counseling or talk therapy. Talk therapy is provided by substance use treatment counselors. It addresses the reasons people use alcohol and ways to keep them from drinking again. The goals of talk therapy are to help people with alcohol use disorder find healthy activities and ways to cope with life stress, to identify and avoid triggers for alcohol use, and to handle cravings, which can cause relapse. °· Medicines. Different medicines can help treat alcohol use disorder through the following actions: °¨ Decrease alcohol cravings. °¨ Decrease the positive reward response felt from alcohol use. °¨ Produce an uncomfortable physical reaction when alcohol is used (aversion therapy). °· Support groups. Support groups are run by people who have quit drinking. They provide emotional support, advice, and guidance. °These forms of treatment are often combined. Some people with alcohol use disorder benefit from intensive combination treatment provided by specialized substance use treatment centers. Both inpatient and outpatient treatment programs are available. °Document Released: 06/24/2004 Document Revised: 10/01/2013 Document Reviewed: 08/24/2012 °ExitCare® Patient Information ©2015 [employer]. This information is not intended to replace advice given to you by your health care provider. Make sure you discuss any questions you have with your health care provider. ° ° ° ° °Emergency Department Resource Guide °1) Find a Doctor and Pay Out of Pocket °Although you won't have to find out who is covered by your insurance plan, it is a good idea to ask around and get recommendations. You will then need to call the office and see if the doctor you have chosen will accept you as a new patient and what types of options they offer for patients who are self-pay. Some doctors offer discounts or will set up payment plans for  their patients who do not have insurance, but you will need to ask so you aren't surprised when you get to your appointment. ° °2) Contact Your Local Health Department °Not all health departments have doctors that can see patients for sick visits, but many do, so it is worth a call to see if yours does. If you don't know where your local health department is, you can check in your phone book. The CDC also has a tool to help you locate your state's health department, and many state websites also have listings of all of their local health departments. ° °3) Find a Walk-in Clinic °If your illness is not likely to be very severe or complicated, you may want to try a walk in clinic. These are popping up all over the country in pharmacies, drugstores, and shopping centers. They're usually staffed by nurse practitioners or physician assistants that have been trained to treat common illnesses and complaints. They're usually fairly quick and inexpensive. However, if you have serious medical issues or chronic medical problems, these are probably not your best option. ° °No Primary Care Doctor: °- Call [facility] Connect at  832-8000 - they can help you locate a primary care doctor that  accepts your insurance, provides certain services, etc. °- Physician Referral Service- 1-800-533-3463 ° °Chronic Pain Problems: °Organization         Address  Phone   Notes  °[facility]  (336) 297-2271 Patients need to be referred by their primary care doctor.  ° °Medication Assistance: °Organization           Address  Phone   Notes  °[place] [facility] [address] °[place], [place] [address] (336) 641-8030 --Must be a resident of [place] °-- Must have NO insurance coverage whatsoever (no Medicaid/ Medicare, etc.) °-- The pt. MUST have a primary care doctor that directs their care regularly and follows them in the community °  °[facility]  (866) 331-1348   °[employer]  (888)  892-1162   ° °Agencies that provide inexpensive medical care: °Organization         Address  Phone   Notes  °[facility]  (336) 832-8035   °[facility]    (336) 832-7272   °[facility] [address] °[place], [place] [address] (336) 832-4777   °[facility] [address], °[place] (336) 271-4999   °[facility]    (336) 373-0678   °[facility]    (336) 272-1050   °[facility] ° [address], [place] Phone:  (336) 832-4444, Fax:  (336) 832-4440 Hours of Operation:  9 am - 6 pm, M-F.  Also accepts Medicaid/Medicare and self-pay.  °[facility] ° [address], [place] Phone: (336) 832-3150, Fax: (336) 832-3151. Hours of Operation:  8:30 am - 5:30 pm, M-F.  Also accepts Medicaid and self-pay.  °[facility] [place] [address], [place] Phone: (336) 878-6027   °[facility] [address], [place], [place] (336)723-1848, Ext. 123 Mondays & Thursdays: 7-9 AM.  First 15 patients are seen on a first come, first serve basis. °  ° °Medicaid-accepting [place] Providers: ° °Organization         Address  Phone   Notes  °[facility] [address], [place] (336) 641-2100 Also accepts self-pay patients.  °[facility] [address], [place] ° (336) 856-9996   °[facility] [address], [place] (336) 288-8857   °[facility] [address] [place] [address], [place] (336) 299-7000   °Veita Bland [address], [place]  ° (336) 373-1557 Only accepts [place] Access Medicaid patients after they have their name applied to their card.  ° °Self-Pay (no insurance) in [place]: ° °Organization         Address  Phone   Notes  °Sickle Cell Patients, [facility] [address], [place] (336)  832-1970   °[facility] [address], [place] (336) 832-4400   °[facility] ° [address], [place] (336) 992-4800   °[facility]/Dr. Osei-Bonsu ° [address] [place] [address], [place] or [address], [place] (336) 841-8500 Phone number for both [place] and [place] locations is the same.  °[facility] [address], [place] (336) 299-0000   °[facility] [address] [place] [address], [place] or [address] (336) 852-7530 °(336) 878-2260   °[facility] [address],  (336) 350-1642, phone; (336) 294-5005, fax Sees patients 1st and 3rd Saturday of every month.  Must not qualify for public or private insurance (i.e. Medicaid, Medicare, [place] Health Choice, Veterans' Benefits) • Household income should be no more than 200% of the poverty level •The clinic cannot treat you if you are pregnant or think you   are pregnant • Sexually transmitted diseases are not treated at the clinic.  ° ° °Dental Care: °Organization         Address  Phone  Notes  °[facility] [address], [place] (336) 641-6152 Accepts children up to age 21 who are enrolled in Medicaid or [place] Health Choice; pregnant women with a Medicaid card; and children who have applied for Medicaid or [place] Health Choice, but were declined, whose parents can pay a reduced fee at time of service.  °[facility] [place]  [address], [place] (336) 641-7733 Accepts children up to age 21 who are enrolled in Medicaid or [place] Health Choice; pregnant women with a Medicaid card; and children who have applied for Medicaid or [place] Health Choice, but were declined, whose parents can pay a reduced fee at time of service.  °[facility] ° [address], [place] (336) 641-4533 Patients are  seen by appointment only. Walk-ins are not accepted. [facility] will see patients 18 years of age and older. °Monday - Tuesday (8am-5pm) °Most Wednesdays (8:30-5pm) °$30 per visit, cash only  °[facility] ° [address], [place] (336) 641-4533 Patients are seen by appointment only. Walk-ins are not accepted. [facility] will see patients 18 years of age and older. °One Wednesday Evening (Monthly: Volunteer Based).  $30 per visit, cash only  °[facility]  (919) 537-3737 for adults; Children under age 4, call Graduate Pediatric Dentistry at (919) [phone]. Children aged 4-14, please call (919) [phone] to request a pediatric application. ° Dental services are provided in all areas of dental care including fillings, crowns and bridges, complete and partial dentures, implants, gum treatment, root canals, and extractions. Preventive care is also provided. Treatment is provided to both adults and children. °Patients are selected via a lottery and there is often a waiting list. °  °[facility] [address], °[place] ° (336) 763-8833 [email or web address] °  °[facility] [address], [place], [place] (336)723-1848, Ext. 123 Second and Fourth Thursday of each month, opens at 6:30 AM; Clinic ends at 9 AM.  Patients are seen on a first-come first-served basis, and a limited number are seen during each clinic.  ° °[facility] ° [address], [place], [place] (336) 723-7904   Eligibility Requirements °You must have lived in [place], [place], or [place] counties for at least the last three months. °  You cannot be eligible for state or federal sponsored healthcare insurance, including Veterans Administration, Medicaid, or Medicare. °  You generally cannot be eligible for healthcare insurance through your employer.  °  How to apply: °Eligibility screenings are held every Tuesday and Wednesday afternoon from 1:00 pm until 4:00  pm. You do not need an appointment for the interview!  °[facility] [address], [place], [place] 336-631-2330   °[facility]  336-342-8273   °[facility]  336-703-3100   °[facility]  336-570-6415   ° °Behavioral Health Resources in the Community: °Intensive Outpatient Programs °Organization         Address  Phone  Notes  °[facility] [address], [place], [place] 336-878-6098   °[facility] [address], [place], [place] 336-832-9800   °[facility] [address], [place], [place] ° 336-882-2125   °[facility] [address],  °[place], [place] 1-800-853-5163 or 336-641-4981   °Substance Abuse Resources °Organization         Address  Phone  Notes  °[facility]  336-882-2125   °[facility]  336-784-9470   °[facility] [place] [facility]  336-285-9073   °[facility]  336-845-3988   °Residential & Outpatient Substance Abuse Program  1-800-659-3381   °Psychological Services °Organization         Address  Phone  Notes  °[facility]  336- 832-9600   °[facility]  336- 378-7881   °[facility] [address], [place] 1-800-853-5163 or 336-641-4981   ° °Mobile Crisis Teams °Organization         Address  Phone  Notes  °[facility], Mobile Crisis Care Unit  1-877-626-1772   °Assertive °[facility] ° [address] [place], [place] 336-834-9664   °Sharon DeEsch [address] °[place] 336-554-5454   ° °Self-Help/Support Groups °Organization         Address  Phone             Notes  °[facility] - variety of support groups  336- 373-1402 Call for more information  °Narcotics Anonymous (NA), [facility] [address], °[place]  2 meetings at this location  ° °Residential Treatment Programs °Organization          Address  Phone  Notes  °[facility] [address],    °[place]  1-866-801-8205   °[facility] ° [address], [place], [place] 704-293-8524   °[facility] [address], [place] 336-845-3988 Admissions: 8am-3pm M-F  °[facility] [address]    °[place], [place] 336-841-1104   °[facility] [address], [place], [place] 336-379-7146   °[facility] [address]  °[place], [place] 336-285-9073   °[facility] Intensive Outpatient [address], [place], [place] 336-852-3033   °[facility] [address]  °[place], [place] 1-877-615-2722 or 336-784-9470   °[facility] ([facility] [address] [place], [place] 336-227-7417 Accepts Medicaid  °[facility] [address]  °[place] 1-800-659-3381 Substance Abuse/Addiction Treatment  ° °[place] Behavioral Health Resources °Organization         Address  Phone  Notes  °[facility]  (888) 581-9988   °Julie Brannon, PhD [address] [place], [place]   (336) 349-5553 or (336) 951-0000   °[facility]   [address] °[place], [place] (336) 349-4454   °[facility] [address], [place], [place] (336) 342-8316 Insurance/Medicaid/sponsorship through [facility]  °[facility] [address]                                    [place], [place] (336) 342-8316 Therapy/tele-psych/case  °[facility] [address]  ° [place], [place] (336) 349-2233    °Dr. Arfeen  (336) 349-4544   °[facility] [place]  [employer] [place] [facility] 1) [address], [place] °2) [address], [place] °3)  [address], [place] (336) 349-3220 °(336) 342-7768 ° °(336) 342-8140   °[place] [facility] (336)   342-1394 or (336) 342-3537 (After Hours)    ° ° ° °
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
## 2014-06-20 NOTE — Progress Notes (Signed)
Patient accepted at [facility] will be contacted per Lennox LaityJodi POD C.   [name], LCSWA Disposition staff 06/20/2014 1:29 PM

## 2014-06-20 NOTE — ED Notes (Signed)
Pt watching tv.  Ativan given.  She wants her lab results

## 2014-06-20 NOTE — ED Notes (Signed)
[facility] to discuss plan of care. If pt is still refusing treatment at facilities other than [facility], she can be discharged.

## 2014-06-20 NOTE — ED Notes (Signed)
[facility] with [facility] at [facility]. Will make referral to [facility].

## 2014-08-31 ENCOUNTER — Emergency Department (HOSPITAL_BASED_OUTPATIENT_CLINIC_OR_DEPARTMENT_OTHER)
Admission: EM | Admit: 2014-08-31 | Discharge: 2014-08-31 | Disposition: A | Discharge status: Home / Self Care | Payer: Medicaid Other | Attending: Emergency Medicine | Admitting: Emergency Medicine

## 2014-08-31 ENCOUNTER — Encounter (HOSPITAL_BASED_OUTPATIENT_CLINIC_OR_DEPARTMENT_OTHER): Payer: Self-pay | Admitting: Emergency Medicine

## 2014-08-31 DIAGNOSIS — Z720 Tobacco use: Secondary | ICD-10-CM | POA: Insufficient documentation

## 2014-08-31 DIAGNOSIS — Z8659 Personal history of other mental and behavioral disorders: Secondary | ICD-10-CM | POA: Diagnosis not present

## 2014-08-31 DIAGNOSIS — Z791 Long term (current) use of non-steroidal anti-inflammatories (NSAID): Secondary | ICD-10-CM | POA: Insufficient documentation

## 2014-08-31 DIAGNOSIS — M75102 Unspecified rotator cuff tear or rupture of left shoulder, not specified as traumatic: Secondary | ICD-10-CM | POA: Insufficient documentation

## 2014-08-31 DIAGNOSIS — M25512 Pain in left shoulder: Secondary | ICD-10-CM | POA: Diagnosis present

## 2014-08-31 MED ORDER — NAPROXEN SODIUM 550 MG PO TABS: ORAL_TABLET | ORAL | Status: DC

## 2014-08-31 MED ORDER — NAPROXEN 250 MG PO TABS
500.0000 mg | ORAL_TABLET | Freq: Once | ORAL | Status: AC
  Administered 2014-08-31: 500 mg via ORAL
  Filled 2014-08-31: qty 2
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
## 2014-08-31 NOTE — ED Notes (Signed)
Patient stattes that her left shoulder has had intermittent pain to it for months and tonight it is the worse.

## 2015-06-09 ENCOUNTER — Emergency Department (HOSPITAL_COMMUNITY)
Admission: EM | Admit: 2015-06-09 | Discharge: 2015-06-09 | Disposition: A | Discharge status: Home / Self Care | Payer: Medicaid Other | Attending: Emergency Medicine | Admitting: Emergency Medicine

## 2015-06-09 ENCOUNTER — Emergency Department (HOSPITAL_COMMUNITY): Payer: Medicaid Other

## 2015-06-09 ENCOUNTER — Encounter (HOSPITAL_COMMUNITY): Payer: Self-pay | Admitting: *Deleted

## 2015-06-09 DIAGNOSIS — F17210 Nicotine dependence, cigarettes, uncomplicated: Secondary | ICD-10-CM | POA: Diagnosis not present

## 2015-06-09 DIAGNOSIS — M25511 Pain in right shoulder: Secondary | ICD-10-CM | POA: Diagnosis not present

## 2015-06-09 DIAGNOSIS — G8921 Chronic pain due to trauma: Secondary | ICD-10-CM | POA: Diagnosis not present

## 2015-06-09 DIAGNOSIS — Z8659 Personal history of other mental and behavioral disorders: Secondary | ICD-10-CM | POA: Diagnosis not present

## 2015-06-09 MED ORDER — METHOCARBAMOL 500 MG PO TABS: 500.0000 mg | ORAL_TABLET | Freq: Two times a day (BID) | ORAL | Status: DC

## 2015-06-09 MED ORDER — IBUPROFEN 400 MG PO TABS
800.0000 mg | ORAL_TABLET | Freq: Once | ORAL | Status: AC
  Administered 2015-06-09: 800 mg via ORAL
  Filled 2015-06-09: qty 2

## 2015-06-09 MED ORDER — METHOCARBAMOL 1000 MG/10ML IJ SOLN
1000.0000 mg | Freq: Once | INTRAMUSCULAR | Status: DC
  Filled 2015-06-09 (×2): qty 10

## 2015-06-09 MED ORDER — METHOCARBAMOL 500 MG PO TABS
1000.0000 mg | ORAL_TABLET | Freq: Once | ORAL | Status: AC
  Administered 2015-06-09: 1000 mg via ORAL
  Filled 2015-06-09: qty 2

## 2015-06-09 MED ORDER — IBUPROFEN 800 MG PO TABS: 800.0000 mg | ORAL_TABLET | Freq: Three times a day (TID) | ORAL | Status: DC
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
## 2015-06-09 NOTE — ED Notes (Signed)
Declined W/C at D/C and was escorted to lobby by RN. 

## 2015-06-09 NOTE — Discharge Instructions (Signed)
[name],  Nice meeting you! Please follow-up with a primary care provider. Information for one is attached. Return to the emergency department if you develop color changes in your arm, have numbness, increased pain, inability to move your arm. Feel better soon!  S. Lane Hacker, PA-C    Emergency Department Resource Guide 1) Find a Doctor and Pay Out of Pocket Although you won't have to find out who is covered by your insurance plan, it is a good idea to ask around and get recommendations. You will then need to call the office and see if the doctor you have chosen will accept you as a new patient and what types of options they offer for patients who are self-pay. Some doctors offer discounts or will set up payment plans for their patients who do not have insurance, but you will need to ask so you aren't surprised when you get to your appointment.  2) Contact Your Local Health Department Not all health departments have doctors that can see patients for sick visits, but many do, so it is worth a call to see if yours does. If you don't know where your local health department is, you can check in your phone book. The CDC also has a tool to help you locate your state's health department, and many state websites also have listings of all of their local health departments.  3) Find a Walk-in Clinic If your illness is not likely to be very severe or complicated, you may want to try a walk in clinic. These are popping up all over the country in pharmacies, drugstores, and shopping centers. They're usually staffed by nurse practitioners or physician assistants that have been trained to treat common illnesses and complaints. They're usually fairly quick and inexpensive. However, if you have serious medical issues or chronic medical problems, these are probably not your best option.  No Primary Care Doctor: - Call Health Connect at  [phone] - they can help you locate a primary care doctor that   accepts your insurance, provides certain services, etc. - Physician Referral Service- [phone]  Chronic Pain Problems: Organization         Address  Phone   Notes  [facility]  [phone] Patients need to be referred by their primary care doctor.   Medication Assistance: Organization         Address  Phone   Notes  [facility] Medication [facility] [address] [place] [place], [place] [address] [phone] --Must be a resident of [facility] [place] -- Must have NO insurance coverage whatsoever (no Medicaid/ Medicare, etc.) -- The pt. MUST have a primary care doctor that directs their care regularly and follows them in the community   [facility]  [phone]   [employer]  [phone]    Agencies that provide inexpensive medical care: Organization         Address  Phone   Notes  [facility]  [phone]   [facility]    [phone]   [facility] [address] [place], [place] [address] [phone]   [facility] [address] [place], [place] [phone]   [facility]    [phone]   [facility]    [phone]   [facility]  [address], [place] Phone:  [phone], Fax:  [phone] Hours of Operation:  9 am - 6 pm, M-F.  Also accepts Medicaid/Medicare and self-pay.  °[facility] ° [address], [place] Phone: [phone]) [phone], Fax: [phone]. Hours of Operation:  8:30 am - 5:30 pm, M-F.  Also accepts Medicaid and self-pay.  °[facility] [place] [address], [place] Phone: ([phone]) [phone]   °[facility] [address], [place], [place] (336)[phone], Ext. 123 Mondays & Thursdays: 7-9 AM.  First 15 patients are seen on a first come, first serve basis. °  ° °Medicaid-accepting [place] Providers: ° °Organization          Address  Phone   Notes  °[facility] [address], [place] ([phone]) [phone] Also accepts self-pay patients.  °[facility] [address], [place] ° (336) [phone]   °[facility] [address], [place] (336) [phone]   °[facility] [address] [place] [address], [place] (336) [phone]   °Veita Bland [address], [place]  ° ([phone]) [phone] Only accepts [place] Access Medicaid patients after they have their name applied to their card.  ° °Self-Pay (no insurance) in [place]: ° °Organization         Address  Phone   Notes  °Sickle Cell Patients, [facility] [address], [place] (336) [phone]   °[facility] [address], [place] (336) [phone]   °[facility] ° [address], [place] (336) [phone]   °[facility]/Dr. Osei-Bonsu ° [address] [place] [address], [place] or [address], [place] ([phone]) [phone] Phone number for both [place] and [place] locations is the same.  °[facility] [address], [place] ([phone]) [phone]   °[facility] [address] [place] [address], [place] or [address] (336) [phone] °(336) [phone]   °[facility] [address], [place] (336) [phone], phone; ([phone]) [phone], fax Sees patients 1st and 3rd Saturday of every month.  Must not qualify for public or private insurance (i.e. Medicaid, Medicare, [place] Health Choice, Veterans' Benefits) • Household income should be no more than 200% of the poverty level •The clinic cannot treat you if you are pregnant or think you are pregnant • Sexually transmitted diseases are not treated at the clinic.  ° ° °Dental Care: °Organization         Address  Phone  Notes  °[place] [facility] [address],  [place] (336) [phone] Accepts children up to age 21 who are enrolled in Medicaid or [place] Health Choice; pregnant women with a Medicaid card; and children who have applied for Medicaid or [place] Health Choice, but were declined, whose parents can pay a reduced fee at time of service.  °[place] [facility] [place]  [address], [place] (336) 641-7733 Accepts children up to age 21 who are enrolled in Medicaid or [place] Health Choice; pregnant women with a Medicaid card; and children who have applied for Medicaid or  Health Choice, but were declined, whose parents can pay a reduced fee at time of service.  °[facility] ° [address], [place] ([phone]) [phone] Patients are seen by appointment only. Walk-ins are not accepted. [facility] will see patients 18 years   of age and older. °Monday - Tuesday (8am-5pm) °Most Wednesdays (8:30-5pm) °$30 per visit, cash only  °[facility] ° [address], [place] (336) [phone] Patients are seen by appointment only. Walk-ins are not accepted. [facility] will see patients 18 years of age and older. °One Wednesday Evening (Monthly: Volunteer Based).  $30 per visit, cash only  °[facility]  ([phone]) [phone] for adults; Children under age 4, call Graduate Pediatric Dentistry at ([phone]. Children aged 4-14, please call [phone]) [phone] to request a pediatric application. ° Dental services are provided in all areas of dental care including fillings, crowns and bridges, complete and partial dentures, implants, gum treatment, root canals, and extractions. Preventive care is also provided. Treatment is provided to both adults and children. °Patients are selected via a lottery and there is often a waiting list. °  °[facility] [address], °[place] ° (336) 763-8833 [email or web address] °  °[facility] [address], [place], [place]  (336)723-1848, Ext. 123 Second and Fourth Thursday of each month, opens at 6:30 AM; Clinic ends at 9 AM.  Patients are seen on a first-come first-served basis, and a limited number are seen during each clinic.  ° °[facility] ° [address], [place], [place] (336) [phone]   Eligibility Requirements °You must have lived in [place], [place], or [place] counties for at least the last three months. °  You cannot be eligible for state or federal sponsored healthcare insurance, including Veterans Administration, Medicaid, or Medicare. °  You generally cannot be eligible for healthcare insurance through your employer.  °  How to apply: °Eligibility screenings are held every Tuesday and Wednesday afternoon from 1:00 pm until 4:00 pm. You do not need an appointment for the interview!  °[facility] [address], [place], [place] 336-631-2330   °[facility]  336-342-8273   °[facility]  336-703-3100   °[facility]  336-570-6415   ° °Behavioral Health Resources in the Community: °Intensive Outpatient Programs °Organization         Address  Phone  Notes  °[facility] [address], [place], [place] 336-878-6098   °[facility] [address], [place], [place] 336-832-9800   °[facility] [address], [place], [place] ° 336-882-2125   °[facility] [address],  °[place], [place] 1-800-853-5163 or 336-641-4981   °Substance Abuse Resources °Organization         Address  Phone  Notes  °[facility]  336-882-2125   °[facility]  336-784-9470   °[facility]  336-285-9073   °[facility]  336-845-3988   °Residential & Outpatient Substance Abuse Program  1-800-659-3381   °Psychological Services °Organization         Address  Phone  Notes  °[facility]  336- 832-9600   °[facility]  336- 378-7881    °[facility] [address], [place] 1-800-853-5163 or 336-641-4981   ° °Mobile Crisis Teams °Organization         Address  Phone  Notes  °[facility], Mobile Crisis Care Unit  1-877-626-1772   °Assertive °[facility] ° [address] [place], [place] 336-834-9664   °Sharon DeEsch [address] °[place]  336-554-5454   ° °Self-Help/Support Groups °Organization           Address  Phone             Notes  [facility] [place] - variety of support groups  336- I7437963(607)[phone] Call for more information  Narcotics Anonymous (NA), [facility] [address], [place]  2 meetings at this location   [facility] Organization         Address  Phone  Notes  [facility] [address],    [place]  [phone]   [facility]  [address], [place] [phone], [place], [place] [phone]   [facility] [address], [place] [phone]) [phone] Admissions: 8am-3pm M-F  [facility] [address]    [place], [place] [phone]   [facility] [address], [place], [place] [phone]   [facility] [address]  [place], [place] [phone]   [facility] Intensive Outpatient [address], [place], [place] [phone]) [phone]   [facility] ([facility] [address]  [place], [place] [phone] or (713)[phone]   [facility] ([facility]) [address] [place], [place] [phone] Accepts Medicaid  [facility] [address]  [place] [phone] Substance Abuse/Addiction Treatment   [facility] Resources Organization         Address  Phone  Notes  [facility]  (717)557-2166(888) (620)[phone]   Angie FavaJulie Brannon, PhD [address], [address] [place], [place]   ([phone]) [phone](336) [phone] or [phone](336) [phone]   [facility]   [address] [place], [place] [phone](336) [phone]   [facility] [address], [place], [place] [phone](336) [phone] Insurance/Medicaid/sponsorship through [facility]  [facility] [address]                                    [place], [place] [phone](336) [phone] Therapy/tele-psych/case  [facility] [address]   [place], [place] [phone](336) [phone]    Dr. Lolly MustacheArfeen  [phone](336) [phone]   [facility] [place]  [employer] [facility] 1) [address], [place] 2) [address], [place] 3)  [address], [place] [phone](336) [phone](336) [phone]  [phone](336) [phone]   [facility] Child Abuse Hotline (920)469-2528(336) 626-386-5036 or [phone](336) (407)[phone] (After Hours)        Joint Pain Joint pain, which is also called arthralgia, can be caused by many things. Joint pain often goes away when you follow your health care provider's instructions for relieving pain at home. However, joint pain can also be caused by conditions that require further treatment. Common causes of joint pain include:  Bruising in the area of the joint.  Overuse of the joint.  Wear and tear on the joints that occur with aging (osteoarthritis).  Various other forms of arthritis.  A buildup of a [place] form of uric acid in the joint (gout).  Infections of the joint (septic arthritis) or of the bone (osteomyelitis). Your health care provider may recommend medicine to help with the pain. If your joint pain continues, additional tests may be needed to diagnose your condition. HOME CARE INSTRUCTIONS Watch your condition for any changes. Follow these instructions as directed to lessen the pain that you are feeling.  Take medicines only as directed by your health care provider.  Rest the affected area for as long as  your health care provider says that you should. If directed to do so, raise the painful joint above the level of your heart while you are sitting or lying down.  Do not do things that cause or  worsen pain.  If directed, apply ice to the painful area:  Put ice in a plastic bag.  Place a towel between your skin and the bag.  Leave the ice on for 20 minutes, 2-3 times per day.  Wear an elastic bandage, splint, or sling as directed by your health care provider. Loosen the elastic bandage or splint if your fingers or toes become numb and tingle, or if they turn cold and blue.  Begin exercising or stretching the affected area as directed by your health care provider. Ask your health care provider what types of exercise are safe for you.  Keep all follow-up visits as directed by your health care provider. This is important. SEEK MEDICAL CARE IF:  Your pain increases, and medicine does not help.  Your joint pain does not improve within 3 days.  You have increased bruising or swelling.  You have a fever.  You lose 10 lb (4.5 kg) or more without trying. SEEK IMMEDIATE MEDICAL CARE IF:  You are not able to move the joint.  Your fingers or toes become numb or they turn cold and blue.   This information is not intended to replace advice given to you by your health care provider. Make sure you discuss any questions you have with your health care provider.   Document Released: 05/17/2005 Document Revised: 06/07/2014 Document Reviewed: 02/26/2014 Elsevier Interactive Patient Education [employer]

## 2015-12-05 ENCOUNTER — Ambulatory Visit (HOSPITAL_COMMUNITY)
Admission: EM | Admit: 2015-12-05 | Discharge: 2015-12-05 | Disposition: A | Discharge status: Home / Self Care | Payer: Medicaid Other | Attending: Family Medicine | Admitting: Family Medicine

## 2015-12-05 ENCOUNTER — Encounter (HOSPITAL_COMMUNITY): Payer: Self-pay | Admitting: Emergency Medicine

## 2015-12-05 DIAGNOSIS — R21 Rash and other nonspecific skin eruption: Secondary | ICD-10-CM

## 2015-12-05 MED ORDER — MUPIROCIN 2 % EX OINT: 1.0000 "application " | TOPICAL_OINTMENT | Freq: Two times a day (BID) | CUTANEOUS | Status: DC

## 2015-12-05 MED ORDER — PERMETHRIN 5 % EX CREA: TOPICAL_CREAM | CUTANEOUS | Status: DC

## 2015-12-05 NOTE — ED Provider Notes (Signed)
CSN: [id number]     Arrival date & time 12/05/15  1542 History   First MD Initiated Contact with Patient 12/05/15 1631     Chief Complaint  Patient presents with  . Rash   (Consider location/radiation/quality/duration/timing/severity/associated sxs/prior Treatment) HPI  Rash. Arms bilat. 3 days. Tracking proximally. Itchy. Rubbing alcohol w/o benefit. No other house mates w rash. No skin allergies. Denies any fevers, shortness of breath, palpitations, neck stiffness, headache, dysuria, frequency, new house mates. Denies any new soaps lotions or detergents.      Past Medical History  Diagnosis Date  . Depression   . Alcohol abuse    Past Surgical History  Procedure Laterality Date  . Leg surgery    . Left wrist injury Left 07/2012    states that she fell on snow and glass   Family History  Problem Relation Age of Onset  . Hypertension Father   . Cancer Paternal Grandfather    Social History  Substance Use Topics  . Smoking status: Current Some Day Smoker -- 0.50 packs/day for 15 years    Types: Cigarettes  . Smokeless tobacco: None  . Alcohol Use: 4.8 oz/week    8 Cans of beer per week     Comment: beer   OB History    Gravida Para Term Preterm AB TAB SAB Ectopic Multiple Living   3 2 2       2      Review of Systems Per HPI with all other pertinent systems negative.   Allergies  Review of patient's allergies indicates no known allergies.  Home Medications   Prior to Admission medications   Medication Sig Start Date End Date Taking? Authorizing Provider  ibuprofen (ADVIL,MOTRIN) 800 MG tablet Take 1 tablet (800 mg total) by mouth 3 (three) times daily. 06/09/15   Melton KrebsSamantha Nicole Riley, PA-C  methocarbamol (ROBAXIN) 500 MG tablet Take 1 tablet (500 mg total) by mouth 2 (two) times daily. 06/09/15   Melton KrebsSamantha Nicole Riley, PA-C  mupirocin ointment (BACTROBAN) 2 % Apply 1 application topically 2 (two) times daily. 12/05/15   Ozella Rocksavid J Kaitlynd Phillips, MD  naproxen sodium (ANAPROX  DS) 550 MG tablet Take 1 tablet twice daily with food as needed for shoulder pain. 08/31/14   John Molpus, MD  permethrin (ELIMITE) 5 % cream Apply topically to entire body and leave on overnight. Wash off in the morning and reapply in 14 days 12/05/15   Ozella Rocksavid J Adarryl Goldammer, MD   Meds Ordered and Administered this Visit  Medications - No data to display  BP 116/71 mmHg  Pulse 90  Temp(Src) 98.4 F (36.9 C) (Oral)  Resp 16  SpO2 96%  LMP 11/03/2015 No data found.   Physical Exam Physical Exam  Constitutional: oriented to person, place, and time. appears well-developed and well-nourished. No distress.  HENT:  Head: Normocephalic and atraumatic.  Eyes: EOMI. PERRL.  Neck: Normal range of motion.  Cardiovascular: RRR, no m/r/g, 2+ distal pulses,  Pulmonary/Chest: Effort normal and breath sounds normal. No respiratory distress.  Abdominal: Soft. Bowel sounds are normal. NonTTP, no distension.  Musculoskeletal: Normal range of motion. Non ttp, no effusion.  Neurological: alert and oriented to person, place, and time.  Skin: Faint papular rash on distal forearm to proximal arm primarily on the dorsal aspect. Hands spared.Marland Kitchen.  Psychiatric: normal mood and affect. behavior is normal. Judgment and thought content normal.   ED Course  Procedures (including critical care time)  Labs Review Labs Reviewed - No data to display  Imaging Review No results found.   Visual Acuity Review  Right Eye Distance:   Left Eye Distance:   Bilateral Distance:    Right Eye Near:   Left Eye Near:    Bilateral Near:         MDM   1. Rash and nonspecific skin eruption     Etiology not immediately clear. Suspect scabies versus folliculitis versus allergic dermatitis. Patient to start with the piercing ointment. If rash persists and use hydrocortisone, if rash continues to persist then will use permethrin cream. Very detailed instructions provided.  Ozella Rocksavid J Zyionna Pesce, MD 12/05/15 [phone]

## 2015-12-05 NOTE — ED Notes (Signed)
C/o rash on bilateral arms onset x4 days... Voices no other concerns... A&O x4... NAD

## 2015-12-05 NOTE — Discharge Instructions (Signed)
The cause of your rashes not immediately clear. This may be due to either scabies, a bacterial infection called folliculitis, or an allergic response: Dermatitis. Please start her therapy by using mupirocin ointment. Use this twice a day for 3-5 days. If your rash clears up then no further treatment is needed. If the rash continues please use over-the-counter hydrocortisone cream. Use this twice a day for 5-7 days. If your rash goes away then no further treatment is needed. If the rash persists please use the permethrin cream as prescribed. If your rash continues after all these treatments please return or go to the emergency room for further treatment.

## 2017-11-17 ENCOUNTER — Other Ambulatory Visit: Payer: Self-pay | Admitting: Nurse Practitioner

## 2017-11-17 DIAGNOSIS — N926 Irregular menstruation, unspecified: Secondary | ICD-10-CM

## 2021-01-08 ENCOUNTER — Observation Stay (HOSPITAL_COMMUNITY)
Admission: EM | Admit: 2021-01-08 | Discharge: 2021-01-10 | Disposition: A | Discharge status: Home / Self Care | Payer: Medicaid Other | Attending: Internal Medicine | Admitting: Internal Medicine

## 2021-01-08 ENCOUNTER — Observation Stay (HOSPITAL_COMMUNITY): Payer: Medicaid Other

## 2021-01-08 ENCOUNTER — Other Ambulatory Visit: Payer: Self-pay

## 2021-01-08 ENCOUNTER — Encounter (HOSPITAL_COMMUNITY): Payer: Self-pay

## 2021-01-08 DIAGNOSIS — Y907 Blood alcohol level of 200-239 mg/100 ml: Secondary | ICD-10-CM | POA: Diagnosis present

## 2021-01-08 DIAGNOSIS — F17210 Nicotine dependence, cigarettes, uncomplicated: Secondary | ICD-10-CM | POA: Insufficient documentation

## 2021-01-08 DIAGNOSIS — Z8249 Family history of ischemic heart disease and other diseases of the circulatory system: Secondary | ICD-10-CM

## 2021-01-08 DIAGNOSIS — Z20822 Contact with and (suspected) exposure to covid-19: Secondary | ICD-10-CM | POA: Insufficient documentation

## 2021-01-08 DIAGNOSIS — F32A Depression, unspecified: Secondary | ICD-10-CM | POA: Diagnosis present

## 2021-01-08 DIAGNOSIS — F10229 Alcohol dependence with intoxication, unspecified: Secondary | ICD-10-CM | POA: Diagnosis present

## 2021-01-08 DIAGNOSIS — D649 Anemia, unspecified: Secondary | ICD-10-CM | POA: Diagnosis not present

## 2021-01-08 DIAGNOSIS — N920 Excessive and frequent menstruation with regular cycle: Secondary | ICD-10-CM | POA: Insufficient documentation

## 2021-01-08 DIAGNOSIS — F10929 Alcohol use, unspecified with intoxication, unspecified: Secondary | ICD-10-CM | POA: Insufficient documentation

## 2021-01-08 DIAGNOSIS — I10 Essential (primary) hypertension: Secondary | ICD-10-CM | POA: Insufficient documentation

## 2021-01-08 DIAGNOSIS — F1020 Alcohol dependence, uncomplicated: Secondary | ICD-10-CM | POA: Diagnosis present

## 2021-01-08 DIAGNOSIS — F1410 Cocaine abuse, uncomplicated: Secondary | ICD-10-CM | POA: Diagnosis present

## 2021-01-08 DIAGNOSIS — D509 Iron deficiency anemia, unspecified: Principal | ICD-10-CM | POA: Diagnosis present

## 2021-01-08 LAB — CBC WITH DIFFERENTIAL/PLATELET
Abs Immature Granulocytes: 0.02 10*3/uL (ref 0.00–0.07)
Basophils Absolute: 0.1 10*3/uL (ref 0.0–0.1)
Basophils Relative: 1 %
Eosinophils Absolute: 0 10*3/uL (ref 0.0–0.5)
Eosinophils Relative: 1 %
HCT: 15.4 % — ABNORMAL LOW (ref 36.0–46.0)
Hemoglobin: 3.8 g/dL — CL (ref 12.0–15.0)
Immature Granulocytes: 0 %
Lymphocytes Relative: 28 %
Lymphs Abs: 1.4 10*3/uL (ref 0.7–4.0)
MCH: 15.1 pg — ABNORMAL LOW (ref 26.0–34.0)
MCHC: 24.7 g/dL — ABNORMAL LOW (ref 30.0–36.0)
MCV: 61.4 fL — ABNORMAL LOW (ref 80.0–100.0)
Monocytes Absolute: 0.6 10*3/uL (ref 0.1–1.0)
Monocytes Relative: 13 %
Neutro Abs: 2.7 10*3/uL (ref 1.7–7.7)
Neutrophils Relative %: 57 %
Platelets: 347 10*3/uL (ref 150–400)
RBC: 2.51 MIL/uL — ABNORMAL LOW (ref 3.87–5.11)
RDW: 22.2 % — ABNORMAL HIGH (ref 11.5–15.5)
WBC: 4.8 10*3/uL (ref 4.0–10.5)
nRBC: 0.8 % — ABNORMAL HIGH (ref 0.0–0.2)

## 2021-01-08 LAB — I-STAT BETA HCG BLOOD, ED (MC, WL, AP ONLY): I-stat hCG, quantitative: <5 m[IU]/mL (ref <5)

## 2021-01-08 LAB — I-STAT CHEM 8, ED
BUN: 10 mg/dL (ref 6–20)
Calcium, Ion: 1.08 mmol/L — ABNORMAL LOW (ref 1.15–1.40)
Chloride: 106 mmol/L (ref 98–111)
Creatinine, Ser: 0.9 mg/dL (ref 0.44–1.00)
Glucose, Bld: 83 mg/dL (ref 70–99)
HCT: 17 % — ABNORMAL LOW (ref 36.0–46.0)
Hemoglobin: 5.8 g/dL — CL (ref 12.0–15.0)
Potassium: 4 mmol/L (ref 3.5–5.1)
Sodium: 139 mmol/L (ref 135–145)
TCO2: 21 mmol/L — ABNORMAL LOW (ref 22–32)

## 2021-01-08 LAB — ETHANOL: Alcohol, Ethyl (B): 225 mg/dL — ABNORMAL HIGH (ref <10)

## 2021-01-08 LAB — URINALYSIS, ROUTINE W REFLEX MICROSCOPIC
Bilirubin Urine: NEGATIVE
Glucose, UA: NEGATIVE mg/dL
Hgb urine dipstick: NEGATIVE
Ketones, ur: NEGATIVE mg/dL
Leukocytes,Ua: NEGATIVE
Nitrite: NEGATIVE
Protein, ur: NEGATIVE mg/dL
Specific Gravity, Urine: 1.003 — ABNORMAL LOW (ref 1.005–1.030)
pH: 6 (ref 5.0–8.0)

## 2021-01-08 LAB — PROTIME-INR
INR: 1.1 (ref 0.8–1.2)
Prothrombin Time: 14.3 seconds (ref 11.4–15.2)

## 2021-01-08 LAB — COMPREHENSIVE METABOLIC PANEL
ALT: 19 U/L (ref 0–44)
AST: 32 U/L (ref 15–41)
Albumin: 3.9 g/dL (ref 3.5–5.0)
Alkaline Phosphatase: 39 U/L (ref 38–126)
Anion gap: 10 (ref 5–15)
BUN: 10 mg/dL (ref 6–20)
CO2: 22 mmol/L (ref 22–32)
Calcium: 8.8 mg/dL — ABNORMAL LOW (ref 8.9–10.3)
Chloride: 105 mmol/L (ref 98–111)
Creatinine, Ser: 0.64 mg/dL (ref 0.44–1.00)
GFR, Estimated: >60 mL/min (ref >60)
Glucose, Bld: 83 mg/dL (ref 70–99)
Potassium: 4 mmol/L (ref 3.5–5.1)
Sodium: 137 mmol/L (ref 135–145)
Total Bilirubin: 0.3 mg/dL (ref 0.3–1.2)
Total Protein: 7.4 g/dL (ref 6.5–8.1)

## 2021-01-08 LAB — RESP PANEL BY RT-PCR (FLU A&B, COVID) ARPGX2
Influenza A by PCR: NEGATIVE
Influenza B by PCR: NEGATIVE
SARS Coronavirus 2 by RT PCR: NEGATIVE

## 2021-01-08 LAB — PREPARE RBC (CROSSMATCH)

## 2021-01-08 LAB — RAPID URINE DRUG SCREEN, HOSP PERFORMED
Amphetamines: NOT DETECTED
Barbiturates: NOT DETECTED
Benzodiazepines: NOT DETECTED
Cocaine: POSITIVE — AB
Opiates: NOT DETECTED
Tetrahydrocannabinol: NOT DETECTED

## 2021-01-08 LAB — HIV ANTIBODY (ROUTINE TESTING W REFLEX): HIV Screen 4th Generation wRfx: NONREACTIVE

## 2021-01-08 LAB — LACTATE DEHYDROGENASE: LDH: 186 U/L (ref 98–192)

## 2021-01-08 LAB — MAGNESIUM: Magnesium: 2.1 mg/dL (ref 1.7–2.4)

## 2021-01-08 LAB — CBG MONITORING, ED: Glucose-Capillary: 76 mg/dL (ref 70–99)

## 2021-01-08 LAB — ABO/RH: ABO/RH(D): A POS

## 2021-01-08 LAB — PHOSPHORUS: Phosphorus: 5 mg/dL — ABNORMAL HIGH (ref 2.5–4.6)

## 2021-01-08 MED ORDER — SODIUM CHLORIDE 0.9 % IV SOLN
1.0000 g | Freq: Once | INTRAVENOUS | Status: AC
  Administered 2021-01-08: 1 g via INTRAVENOUS
  Filled 2021-01-08: qty 10

## 2021-01-08 MED ORDER — LORAZEPAM 1 MG PO TABS: 1.0000 mg | ORAL_TABLET | ORAL | Status: DC | PRN

## 2021-01-08 MED ORDER — LORAZEPAM 2 MG/ML IJ SOLN: 1.0000 mg | INTRAMUSCULAR | Status: DC | PRN

## 2021-01-08 MED ORDER — ONDANSETRON HCL 4 MG/2ML IJ SOLN: 4.0000 mg | Freq: Four times a day (QID) | INTRAMUSCULAR | Status: DC | PRN

## 2021-01-08 MED ORDER — SENNOSIDES-DOCUSATE SODIUM 8.6-50 MG PO TABS: 1.0000 | ORAL_TABLET | Freq: Every evening | ORAL | Status: DC | PRN

## 2021-01-08 MED ORDER — FOLIC ACID 1 MG PO TABS
1.0000 mg | ORAL_TABLET | Freq: Every day | ORAL | Status: DC
  Administered 2021-01-08 – 2021-01-10 (×3): 1 mg via ORAL
  Filled 2021-01-08 (×3): qty 1

## 2021-01-08 MED ORDER — PANTOPRAZOLE 80MG IVPB - SIMPLE MED
80.0000 mg | Freq: Once | INTRAVENOUS | Status: AC
  Administered 2021-01-08: 80 mg via INTRAVENOUS
  Filled 2021-01-08: qty 80

## 2021-01-08 MED ORDER — ONDANSETRON HCL 4 MG PO TABS: 4.0000 mg | ORAL_TABLET | Freq: Four times a day (QID) | ORAL | Status: DC | PRN

## 2021-01-08 MED ORDER — THIAMINE HCL 100 MG/ML IJ SOLN
100.0000 mg | Freq: Every day | INTRAMUSCULAR | Status: DC
  Filled 2021-01-08: qty 2

## 2021-01-08 MED ORDER — ADULT MULTIVITAMIN W/MINERALS CH
1.0000 | ORAL_TABLET | Freq: Every day | ORAL | Status: DC
  Administered 2021-01-08 – 2021-01-10 (×3): 1 via ORAL
  Filled 2021-01-08 (×3): qty 1

## 2021-01-08 MED ORDER — SODIUM CHLORIDE 0.9% IV SOLUTION: Freq: Once | INTRAVENOUS | Status: DC

## 2021-01-08 MED ORDER — LORAZEPAM 2 MG/ML IJ SOLN: 0.0000 mg | Freq: Four times a day (QID) | INTRAMUSCULAR | Status: DC

## 2021-01-08 MED ORDER — LORAZEPAM 2 MG/ML IJ SOLN: 0.0000 mg | Freq: Two times a day (BID) | INTRAMUSCULAR | Status: DC

## 2021-01-08 MED ORDER — NICOTINE 14 MG/24HR TD PT24
14.0000 mg | MEDICATED_PATCH | Freq: Every day | TRANSDERMAL | Status: DC
  Administered 2021-01-09 – 2021-01-10 (×3): 14 mg via TRANSDERMAL
  Filled 2021-01-08 (×4): qty 1

## 2021-01-08 MED ORDER — SODIUM CHLORIDE 0.9 % IV SOLN
10.0000 mL/h | Freq: Once | INTRAVENOUS | Status: AC
  Administered 2021-01-08: 10 mL/h via INTRAVENOUS

## 2021-01-08 MED ORDER — SODIUM CHLORIDE 0.9 % IV SOLN: INTRAVENOUS | Status: DC

## 2021-01-08 MED ORDER — HYDRALAZINE HCL 20 MG/ML IJ SOLN: 10.0000 mg | Freq: Three times a day (TID) | INTRAMUSCULAR | Status: DC | PRN

## 2021-01-08 MED ORDER — NICOTINE 14 MG/24HR TD PT24: 14.0000 mg | MEDICATED_PATCH | Freq: Once | TRANSDERMAL | Status: DC

## 2021-01-08 MED ORDER — ACETAMINOPHEN 325 MG PO TABS
650.0000 mg | ORAL_TABLET | Freq: Four times a day (QID) | ORAL | Status: DC | PRN
  Administered 2021-01-08: 650 mg via ORAL
  Filled 2021-01-08: qty 2

## 2021-01-08 MED ORDER — THIAMINE HCL 100 MG PO TABS
100.0000 mg | ORAL_TABLET | Freq: Every day | ORAL | Status: DC
  Administered 2021-01-08 – 2021-01-10 (×3): 100 mg via ORAL
  Filled 2021-01-08 (×3): qty 1

## 2021-01-08 MED ORDER — ACETAMINOPHEN 650 MG RE SUPP: 650.0000 mg | Freq: Four times a day (QID) | RECTAL | Status: DC | PRN

## 2021-01-08 NOTE — ED Provider Notes (Signed)
Emergency Medicine Provider Triage Evaluation Note  [name] , a 45 y.o. female  was evaluated in triage.  Pt complains of abnormal lab results, fatigue, and exertional shortness of breath.  Patient reports that her primary care provider called her today and told her that her hemoglobin was 3.8 and hematocrit was 15.2.  Patient advised to go to the emergency department.  Patient endorses that she has had been having fatigue and exertional shortness of breath.  Patient denies any blood in stool, melena, vaginal bleeding, hematemesis or coffee-ground emesis.  She reports LMP was last week.  Denies any chest pain.  Patient expressed concern for possible STI with nurse.  Review of Systems  Positive: Fatigue, exertional shortness of breath Negative: Blood in stool, melena, hematemesis, coffee-ground emesis, vaginal bleeding  Physical Exam  BP 130/86   Pulse (!) 106   Temp 99 F (37.2 C)   Resp 20   LMP 12/30/2020 (Approximate)   SpO2 99%  Gen:   Awake, no distress  Resp:  Normal effort,  MSK:   Moves extremities without difficulty  Other:    Medical Decision Making  Medically screening exam initiated at 2:03 PM.  Appropriate orders placed.  Malerie O Letizia was informed that the remainder of the evaluation will be completed by another provider, this initial triage assessment does not replace that evaluation, and the importance of remaining in the ED until their evaluation is complete.  The patient appears stable so that the remainder of the work up may be completed by another provider.      Berneice Heinrich 01/08/21 1405    Pollyann Savoy, MD 01/08/21 [phone]

## 2021-01-08 NOTE — H&P (Signed)
History and Physical  [name]:[id number] DOB: 1976/03/31 DOA: 01/08/2021  PCP: Patient, No Pcp Per (Inactive) Patient coming from: Home   I have personally briefly reviewed patient's old medical records in [facility]   Chief Complaint: abnormal labs.   HPI: [name] is a 45 y.o. female past medical history significant for alcohol dependence, depression, drug use disorder (cocaine),  She was seen by her PCP recently lab work was obtain. She was referred by her PCP to the ED due to low hemoglobin.  Patient report feeling weak and tired for the last month, symptom has been getting worse recently.  She denies melena, hematochezia or hematemesis.  She report heavy menstrual.  She changed tampons very frequently (10 per day). Her last menstrual period was last week. She is not bleeding currently.   She drinks alcohol, she didn't want to specify how much she drinks.   Evaluation  in the ED: Hemoglobin 3.8--repeated 5.8.  Normal liver function test normal kidney function.  UDS positive for cocaine.  Alcohol level 225.  HIV nonreactive.    Review of Systems: All systems reviewed and apart from history of presenting illness, are negative.  Past Medical History:  Diagnosis Date   Alcohol abuse    Depression    Past Surgical History:  Procedure Laterality Date   left wrist injury Left 07/2012   states that she fell on snow and glass   LEG SURGERY     Social History:  reports that she has been smoking cigarettes. She has a 7.50 pack-year smoking history. She does not have any smokeless tobacco history on file. She reports current alcohol use of about 8.0 standard drinks per week. She reports that she does not use drugs.   No Known Allergies  Family History  Problem Relation Age of Onset   Hypertension Father    Cancer Paternal Grandfather     Prior to Admission medications   Medication Sig Start Date End Date Taking? Authorizing Provider  ibuprofen  (ADVIL,MOTRIN) 800 MG tablet Take 1 tablet (800 mg total) by mouth 3 (three) times daily. 06/09/15   Melton Krebs, PA-C  methocarbamol (ROBAXIN) 500 MG tablet Take 1 tablet (500 mg total) by mouth 2 (two) times daily. 06/09/15   Melton Krebs, PA-C  mupirocin ointment (BACTROBAN) 2 % Apply 1 application topically 2 (two) times daily. 12/05/15   Ozella Rocks, Richardson  naproxen sodium (ANAPROX DS) 550 MG tablet Take 1 tablet twice daily with food as needed for shoulder pain. 08/31/14   Molpus, John, Richardson  permethrin (ELIMITE) 5 % cream Apply topically to entire body and leave on overnight. Wash off in the morning and reapply in 14 days 12/05/15   Ozella Rocks, Richardson   Physical Exam: Vitals:   01/08/21 1350 01/08/21 1445 01/08/21 1457  BP: 130/86 (!) 139/99   Pulse: (!) 106 (!) 108   Resp: 20 (!) 23   Temp: 99 F (37.2 C)    SpO2: 99% 100%   Weight:   69.9 kg  Height:   5\' 7"  (1.702 m)    General exam: Moderately built and nourished patient, lying comfortably supine on the gurney in no obvious distress. pale Head, eyes and ENT: Nontraumatic and normocephalic. Pupils equally reacting to light and accommodation. Oral mucosa moist. Neck: Supple. No JVD, carotid bruit or thyromegaly. Lymphatics: No lymphadenopathy. Respiratory system: Clear to auscultation. No increased work of breathing. Cardiovascular system: S1 and S2 heard,  RRR. No JVD, murmurs, gallops, clicks or pedal edema. Gastrointestinal system: Abdomen is nondistended, soft and nontender. Normal bowel sounds heard. No organomegaly or masses appreciated.  Central nervous system: Alert and oriented. No focal neurological deficits. Extremities: Symmetric 5 x 5 power. Peripheral pulses symmetrically felt.  Skin: No rashes or acute findings. Musculoskeletal system: Negative exam. Psychiatry: Pleasant and cooperative.   Labs on Admission:  Basic Metabolic Panel: Recent Labs  Lab 01/08/21 1403 01/08/21 1411  NA 137 139  K  4.0 4.0  CL 105 106  CO2 22  --   GLUCOSE 83 83  BUN 10 10  CREATININE 0.64 0.90  CALCIUM 8.8*  --    Liver Function Tests: Recent Labs  Lab 01/08/21 1403  AST 32  ALT 19  ALKPHOS 39  BILITOT 0.3  PROT 7.4  ALBUMIN 3.9   No results for input(s): LIPASE, AMYLASE in the last 168 hours. No results for input(s): AMMONIA in the last 168 hours. CBC: Recent Labs  Lab 01/08/21 1403 01/08/21 1411  WBC 4.8  --   NEUTROABS 2.7  --   HGB 3.8* 5.8*  HCT 15.4* 17.0*  MCV 61.4*  --   PLT 347  --    Cardiac Enzymes: No results for input(s): CKTOTAL, CKMB, CKMBINDEX, TROPONINI in the last 168 hours.  BNP (last 3 results) No results for input(s): PROBNP in the last 8760 hours. CBG: Recent Labs  Lab 01/08/21 1403  GLUCAP 76    Radiological Exams on Admission: No results found.  EKG: Independently reviewed. Sinus tachycardia.   Assessment/Plan Active Problems:   Alcohol dependence (HCC)   Anemia   Severe anemia   Menorrhagia   1-Severe Anemia;  Likely iron deficiency in setting of menorrhagia.  No evidence of GI bleed.  Hb on admission 5.8.  Anemia panel ordered.  2 unit PRBC order for transfusion.  Follow hb post transfusion, might need further transfusion.  Might need IV iron.    2-Menorrhagia;  Check TSH. Check Transvaginal US>  I asked ED to consult GYN.   3-Alcohol use;  Start thiamine and folic acid.  CIWA protocol ordered.  Counseling provided.          DVT Prophylaxis: SCD Code Status: Full Code Family Communication: Care discussed with patient.  Disposition Plan: Admit under observation for blood transfusion.    Time spent: 75 minutes.   [name] [facility]   01/08/2021, 5:46 PM

## 2021-01-08 NOTE — ED Notes (Signed)
Pt given a cup of ice per RN. Pt encouraged to not eat any food at this time. However, pt seen eating chips at bedside.

## 2021-01-08 NOTE — Progress Notes (Signed)
Patient transferred from Emergency Department to [phone]. Airborne/contact isolation precautions discontinued due to resulted covid test being negative. Patient is alert and oriented to person, place, time, and situation. Patient is anxious and irritable at this time. Telemetry monitoring enabled, vital signs taken, and IVs assessed for patency. Skin checked with Sabino Gasser. Fall precautions initiated. Patient bed in the locked, lowest position. Non-slip socks in place and bed alarm on. Call bell is within reach. Patient knows to call for assistance prior to getting up and patient demonstrates use. Patient is laying comfortably in bed.

## 2021-01-08 NOTE — ED Notes (Signed)
Patient transported to Ultrasound 

## 2021-01-08 NOTE — ED Provider Notes (Signed)
[facility] EMERGENCY DEPARTMENT Provider Note   CSN: [id number] Arrival date & time: 01/08/21  1254     History CC - Anemia   [name] is a 45 y.o. female.  HPI  45 year old female with a past medical history of alcohol abuse presenting to the emergency department due to concerns for anemia.  Patient reports that she has been lightheaded for the past month.  This is worse when moving or when she stands upright after sitting.  She has also had some exertional dyspnea over the past month.  She states that her menstrual cycles have been heavier than usual over the past several times, she states that they are still lasting only 7 days but she will use 8-9 tampons per day which is atypical for her.  She denies any chest pain or shortness of breath at rest.  She denies any hematochezia or melena.  She states that her primary care doctor obtained blood work the other day and told her that her hemoglobin was 3.8 and that she should present to the emergency department right away.  She denies any smoking.  Patient states that she drinks x2 40 ounce alcoholic beverages per day and last drink this morning.  She denies any other drug use.  Past Medical History:  Diagnosis Date   Alcohol abuse    Depression     Patient Active Problem List   Diagnosis Date Noted   Anemia 01/08/2021   Severe anemia 01/08/2021   Menorrhagia 01/08/2021   Cervical high risk HPV (human papillomavirus) test positive 09/03/2012   AMA (advanced maternal age) multigravida 35+ 08/21/2012   Supervision of normal intrauterine pregnancy in multigravida 08/21/2012   Tobacco smoking complicating pregnancy 08/21/2012   Maternal alcohol use complicating pregnancy, antepartum 08/21/2012   Alcohol dependence (HCC) 06/21/2012   Alcohol withdrawal (HCC) 06/21/2012    Past Surgical History:  Procedure Laterality Date   left wrist injury Left 07/2012   states that she fell on snow and glass   LEG SURGERY        OB History     Gravida  3   Para  2   Term  2   Preterm      AB      Living  2      SAB      IAB      Ectopic      Multiple      Live Births  2           Family History  Problem Relation Age of Onset   Hypertension Father    Cancer Paternal Grandfather     Social History   Tobacco Use   Smoking status: Some Days    Packs/day: 0.50    Years: 15.00    Pack years: 7.50    Types: Cigarettes  Substance Use Topics   Alcohol use: Yes    Alcohol/week: 8.0 standard drinks    Types: 8 Cans of beer per week    Comment: beer   Drug use: No    Home Medications Prior to Admission medications   Medication Sig Start Date End Date Taking? Authorizing Provider  ibuprofen (ADVIL,MOTRIN) 800 MG tablet Take 1 tablet (800 mg total) by mouth 3 (three) times daily. 06/09/15   Melton Krebs, PA-C  naproxen sodium (ANAPROX DS) 550 MG tablet Take 1 tablet twice daily with food as needed for shoulder pain. 08/31/14   Molpus, Jonny Ruiz, MD  permethrin Verner Mould)  5 % cream Apply topically to entire body and leave on overnight. Wash off in the morning and reapply in 14 days 12/05/15   Ozella Rocks, MD    Allergies    Patient has no known allergies.  Review of Systems   Review of Systems  Constitutional:  Negative for chills and fever.  HENT:  Negative for ear pain and sore throat.   Eyes:  Negative for pain and visual disturbance.  Respiratory:  Positive for shortness of breath. Negative for cough.   Cardiovascular:  Negative for chest pain and palpitations.  Gastrointestinal:  Negative for abdominal pain and vomiting.  Genitourinary:  Negative for dysuria and hematuria.  Musculoskeletal:  Negative for arthralgias and back pain.  Skin:  Negative for color change and rash.  Neurological:  Positive for light-headedness. Negative for seizures and syncope.  All other systems reviewed and are negative.  Physical Exam Updated Vital Signs BP (!) 139/99   Pulse (!)  108   Temp 99 F (37.2 C)   Resp (!) 23   Ht 5\' 7"  (1.702 m)   Wt 69.9 kg   LMP 12/30/2020 (Approximate)   SpO2 100%   BMI 24.12 kg/m   Physical Exam Vitals and nursing note reviewed.  Constitutional:      General: She is not in acute distress.    Appearance: Normal appearance. She is well-developed.  HENT:     Head: Normocephalic and atraumatic.  Eyes:     Conjunctiva/sclera: Conjunctivae normal.     Comments: Pale conjunctive a bilaterally  Cardiovascular:     Rate and Rhythm: Regular rhythm. Tachycardia present.     Heart sounds: No murmur heard. Pulmonary:     Effort: Pulmonary effort is normal. No respiratory distress.     Breath sounds: Normal breath sounds.  Abdominal:     Palpations: Abdomen is soft.     Tenderness: There is no abdominal tenderness.  Genitourinary:    Comments: Rectal exam negative for gross blood, brown stool in the rectal vault. Musculoskeletal:     Cervical back: Neck supple.  Skin:    General: Skin is warm and dry.     Capillary Refill: Capillary refill takes 2 to 3 seconds.  Neurological:     Mental Status: She is alert and oriented to person, place, and time.     Comments: The patient is oriented x3.  She has an elated affect and will giggle inappropriately, but she is able to tell me her name, date, events surrounding her being here.    ED Results / Procedures / Treatments   Labs (all labs ordered are listed, but only abnormal results are displayed) Labs Reviewed  COMPREHENSIVE METABOLIC PANEL - Abnormal; Notable for the following components:      Result Value   Calcium 8.8 (*)    All other components within normal limits  CBC WITH DIFFERENTIAL/PLATELET - Abnormal; Notable for the following components:   RBC 2.51 (*)    Hemoglobin 3.8 (*)    HCT 15.4 (*)    MCV 61.4 (*)    MCH 15.1 (*)    MCHC 24.7 (*)    RDW 22.2 (*)    nRBC 0.8 (*)    All other components within normal limits  ETHANOL - Abnormal; Notable for the following  components:   Alcohol, Ethyl (B) 225 (*)    All other components within normal limits  RAPID URINE DRUG SCREEN, HOSP PERFORMED - Abnormal; Notable for the following components:  Cocaine POSITIVE (*)    All other components within normal limits  URINALYSIS, ROUTINE W REFLEX MICROSCOPIC - Abnormal; Notable for the following components:   Color, Urine STRAW (*)    APPearance HAZY (*)    Specific Gravity, Urine 1.003 (*)    All other components within normal limits  I-STAT CHEM 8, ED - Abnormal; Notable for the following components:   Calcium, Ion 1.08 (*)    TCO2 21 (*)    Hemoglobin 5.8 (*)    HCT 17.0 (*)    All other components within normal limits  RESP PANEL BY RT-PCR (FLU A&B, COVID) ARPGX2  HIV ANTIBODY (ROUTINE TESTING W REFLEX)  RPR  VITAMIN B12  FOLATE  IRON AND TIBC  FERRITIN  RETICULOCYTES  LACTATE DEHYDROGENASE  HAPTOGLOBIN  PROTIME-INR  TSH  MAGNESIUM  PHOSPHORUS  CBC  CBG MONITORING, ED  I-STAT BETA HCG BLOOD, ED ([facility], [facility], [facility] ONLY)  TYPE AND SCREEN  ABO/RH  PREPARE RBC (CROSSMATCH)  PREPARE RBC (CROSSMATCH)    EKG EKG Interpretation  Date/Time:  Thursday January 08 2021 13:52:33 EDT Ventricular Rate:  105 PR Interval:  148 QRS Duration: 74 QT Interval:  356 QTC Calculation: 470 R Axis:   70 Text Interpretation: Sinus tachycardia Otherwise normal ECG since last tracing no significant change Confirmed by Eber HongMiller, Brian ([id number]) on 01/08/2021 3:01:03 PM  Radiology No results found.  Procedures Procedures   Medications Ordered in ED Medications  0.9 %  sodium chloride infusion (has no administration in time range)  0.9 %  sodium chloride infusion (Manually program via Guardrails IV Fluids) (has no administration in time range)  0.9 %  sodium chloride infusion (has no administration in time range)  LORazepam (ATIVAN) tablet 1-4 mg (has no administration in time range)    Or  LORazepam (ATIVAN) injection 1-4 mg (has no administration in time range)   thiamine tablet 100 mg (has no administration in time range)    Or  thiamine (B-1) injection 100 mg (has no administration in time range)  folic acid (FOLVITE) tablet 1 mg (has no administration in time range)  multivitamin with minerals tablet 1 tablet (has no administration in time range)  LORazepam (ATIVAN) injection 0-4 mg (has no administration in time range)    Followed by  LORazepam (ATIVAN) injection 0-4 mg (has no administration in time range)  pantoprazole (PROTONIX) 80 mg /NS 100 mL IVPB (0 mg Intravenous Stopped 01/08/21 1751)  cefTRIAXone (ROCEPHIN) 1 g in sodium chloride 0.9 % 100 mL IVPB (0 g Intravenous Stopped 01/08/21 1653)    ED Course  I have reviewed the triage vital signs and the nursing notes.  Pertinent labs & imaging results that were available during my care of the patient were reviewed by me and considered in my medical decision making (see chart for details).    MDM Rules/Calculators/A&P                           45 year old female with alcohol abuse presenting due to anemia found on outpatient labs.  Vital signs reviewed, she is tachycardic to around 110.  She describes symptoms that could be consistent with symptomatic anemia such as lightheadedness, exertional dyspnea, fatigue.  Physical exam is notable for pale conjunctive a bilaterally.  She also seems clinically intoxicated, although states that she only drink much earlier this morning.  We will obtain a CBC, CMP, blood glucose, ethanol, and type and screen.  Beta-hCG negative.  Stat hemoglobin of 5, will administer empiric PPI and IV ceftriaxone given her history of alcohol abuse and unknown varices status.  Hemoglobin returned low at 3.8, with severe microcytosis.  Rectal exam negative for blood, brown stool on exam.  Suspect that the etiology for hemoglobin is menorrhagia.  Given her severe anemia and symptomatic nature, will transfuse here and admit to the hospitalist.  Inpatient team consulted for  admission.  Final Clinical Impression(s) / ED Diagnoses Final diagnoses:  Severe anemia  Alcoholic intoxication with complication [facility])    Rx / DC Orders ED Discharge Orders     None        Lenard Lance, MD 01/08/21 Izola Price    Eber Hong, MD 01/10/21 1214

## 2021-01-08 NOTE — ED Triage Notes (Signed)
Per pt: She had blood work drawn last Thursday and her PCP called her today and told her that her hemoglobin was 3.8 and that her hematocrit was 15.2 and to go to the ED. Pt states that she has had generalized weakness. Pt intermittently tearful in triage, having difficulty following directions and sitting still.

## 2021-01-08 NOTE — ED Provider Notes (Signed)
I saw and evaluated the patient, reviewed the resident's note and I agree with the findings and plan.  Pertinent History: This patient is a 45 year old female, she has an heavy alcohol particular, she presents with severe anemia, on exam the patient is severely anemic appearing, she has extremely pale conjunctive a, she is tachycardic to 110 bpm, she is normotensive infectious slightly hypertensive.  She reports that she drinks significant amounts of alcohol and appears intoxicated on my exam  The patient had several episodes where she had severe outbursts of agitation due to family members that were not able to come back to be with her, this was hospital policy, we were able to help calm her down, she was willing to be admitted and receive an acute blood transfusion for her severe anemia, tachycardia slowly improved, discussed with consultants, patient will be admitted to higher level of care  I was personally present and directly supervised the following procedures:  Medical evaluation  Critical Care - acute transfusion  .Critical Care  Date/Time: 01/08/2021 3:13 PM Performed by: Eber Hong, MD Authorized by: Eber Hong, MD   Critical care provider statement:    Critical care time (minutes):  35   Critical care time was exclusive of:  Separately billable procedures and treating other patients and teaching time   Critical care was necessary to treat or prevent imminent or life-threatening deterioration of the following conditions: Severe Anemia.   Critical care was time spent personally by me on the following activities:  Blood draw for specimens, development of treatment plan with patient or surrogate, discussions with consultants, evaluation of patient's response to treatment, examination of patient, obtaining history from patient or surrogate, ordering and performing treatments and interventions, ordering and review of laboratory studies, ordering and review of radiographic studies,  pulse oximetry, re-evaluation of patient's condition and review of old charts   I personally interpreted the EKG as well as the resident and agree with the interpretation on the resident's chart.  Final diagnoses:  Severe anemia  Alcoholic intoxication with complication [facility])      Eber Hong, MD 01/10/21 1215

## 2021-01-09 ENCOUNTER — Encounter (HOSPITAL_COMMUNITY): Payer: Self-pay | Admitting: Internal Medicine

## 2021-01-09 DIAGNOSIS — F17210 Nicotine dependence, cigarettes, uncomplicated: Secondary | ICD-10-CM | POA: Diagnosis present

## 2021-01-09 DIAGNOSIS — D649 Anemia, unspecified: Secondary | ICD-10-CM | POA: Diagnosis not present

## 2021-01-09 DIAGNOSIS — I10 Essential (primary) hypertension: Secondary | ICD-10-CM | POA: Diagnosis present

## 2021-01-09 DIAGNOSIS — F1410 Cocaine abuse, uncomplicated: Secondary | ICD-10-CM | POA: Diagnosis present

## 2021-01-09 DIAGNOSIS — Z8249 Family history of ischemic heart disease and other diseases of the circulatory system: Secondary | ICD-10-CM | POA: Diagnosis not present

## 2021-01-09 DIAGNOSIS — Z20822 Contact with and (suspected) exposure to covid-19: Secondary | ICD-10-CM | POA: Diagnosis present

## 2021-01-09 DIAGNOSIS — N920 Excessive and frequent menstruation with regular cycle: Secondary | ICD-10-CM | POA: Diagnosis present

## 2021-01-09 DIAGNOSIS — F10229 Alcohol dependence with intoxication, unspecified: Secondary | ICD-10-CM | POA: Diagnosis present

## 2021-01-09 DIAGNOSIS — Y907 Blood alcohol level of 200-239 mg/100 ml: Secondary | ICD-10-CM | POA: Diagnosis present

## 2021-01-09 DIAGNOSIS — D509 Iron deficiency anemia, unspecified: Secondary | ICD-10-CM | POA: Diagnosis present

## 2021-01-09 DIAGNOSIS — F32A Depression, unspecified: Secondary | ICD-10-CM | POA: Diagnosis present

## 2021-01-09 LAB — CBC
HCT: 17 % — ABNORMAL LOW (ref 36.0–46.0)
HCT: 17.3 % — ABNORMAL LOW (ref 36.0–46.0)
HCT: 20.7 % — ABNORMAL LOW (ref 36.0–46.0)
HCT: 28 % — ABNORMAL LOW (ref 36.0–46.0)
Hemoglobin: 4.6 g/dL — CL (ref 12.0–15.0)
Hemoglobin: 4.9 g/dL — CL (ref 12.0–15.0)
Hemoglobin: 6.2 g/dL — CL (ref 12.0–15.0)
Hemoglobin: 8.3 g/dL — ABNORMAL LOW (ref 12.0–15.0)
MCH: 17 pg — ABNORMAL LOW (ref 26.0–34.0)
MCH: 17.6 pg — ABNORMAL LOW (ref 26.0–34.0)
MCH: 19.7 pg — ABNORMAL LOW (ref 26.0–34.0)
MCH: 21.4 pg — ABNORMAL LOW (ref 26.0–34.0)
MCHC: 27.1 g/dL — ABNORMAL LOW (ref 30.0–36.0)
MCHC: 28.3 g/dL — ABNORMAL LOW (ref 30.0–36.0)
MCHC: 30 g/dL (ref 30.0–36.0)
MCHC: 29.6 g/dL — ABNORMAL LOW (ref 30.0–36.0)
MCV: 62 fL — ABNORMAL LOW (ref 80.0–100.0)
MCV: 63 fL — ABNORMAL LOW (ref 80.0–100.0)
MCV: 65.7 fL — ABNORMAL LOW (ref 80.0–100.0)
MCV: 72.2 fL — ABNORMAL LOW (ref 80.0–100.0)
Platelets: 314 10*3/uL (ref 150–400)
Platelets: 325 10*3/uL (ref 150–400)
Platelets: 291 10*3/uL (ref 150–400)
Platelets: 277 10*3/uL (ref 150–400)
RBC: 2.7 MIL/uL — ABNORMAL LOW (ref 3.87–5.11)
RBC: 2.79 MIL/uL — ABNORMAL LOW (ref 3.87–5.11)
RBC: 3.15 MIL/uL — ABNORMAL LOW (ref 3.87–5.11)
RBC: 3.88 MIL/uL (ref 3.87–5.11)
RDW: 24.9 % — ABNORMAL HIGH (ref 11.5–15.5)
RDW: 25.1 % — ABNORMAL HIGH (ref 11.5–15.5)
RDW: 28.3 % — ABNORMAL HIGH (ref 11.5–15.5)
RDW: 28.6 % — ABNORMAL HIGH (ref 11.5–15.5)
WBC: 5 10*3/uL (ref 4.0–10.5)
WBC: 5 10*3/uL (ref 4.0–10.5)
WBC: 5.1 10*3/uL (ref 4.0–10.5)
WBC: 6 10*3/uL (ref 4.0–10.5)
nRBC: 0.6 % — ABNORMAL HIGH (ref 0.0–0.2)
nRBC: 0.8 % — ABNORMAL HIGH (ref 0.0–0.2)
nRBC: 0.6 % — ABNORMAL HIGH (ref 0.0–0.2)
nRBC: 1 % — ABNORMAL HIGH (ref 0.0–0.2)

## 2021-01-09 LAB — IRON AND TIBC
Iron: 26 ug/dL — ABNORMAL LOW (ref 28–170)
Saturation Ratios: 4 % — ABNORMAL LOW (ref 10.4–31.8)
TIBC: 620 ug/dL — ABNORMAL HIGH (ref 250–450)
UIBC: 594 ug/dL

## 2021-01-09 LAB — FERRITIN: Ferritin: 2 ng/mL — ABNORMAL LOW (ref 11–307)

## 2021-01-09 LAB — RETICULOCYTES
Immature Retic Fract: 16.1 % — ABNORMAL HIGH (ref 2.3–15.9)
RBC.: 2.79 MIL/uL — ABNORMAL LOW (ref 3.87–5.11)
Retic Count, Absolute: 14.8 10*3/uL — ABNORMAL LOW (ref 19.0–186.0)

## 2021-01-09 LAB — TSH: TSH: 0.621 u[IU]/mL (ref 0.350–4.500)

## 2021-01-09 LAB — FOLATE: Folate: 13.3 ng/mL (ref >5.9)

## 2021-01-09 LAB — RPR: RPR Ser Ql: NONREACTIVE

## 2021-01-09 LAB — VITAMIN B12: Vitamin B-12: 601 pg/mL (ref 180–914)

## 2021-01-09 LAB — PREPARE RBC (CROSSMATCH)

## 2021-01-09 MED ORDER — SODIUM CHLORIDE 0.9% IV SOLUTION: Freq: Once | INTRAVENOUS | Status: AC

## 2021-01-09 MED ORDER — MEGESTROL ACETATE 40 MG PO TABS
40.0000 mg | ORAL_TABLET | Freq: Every day | ORAL | Status: DC
  Administered 2021-01-09 – 2021-01-10 (×2): 40 mg via ORAL
  Filled 2021-01-09 (×2): qty 1

## 2021-01-09 MED ORDER — AMLODIPINE BESYLATE 5 MG PO TABS
5.0000 mg | ORAL_TABLET | Freq: Every day | ORAL | Status: DC
  Administered 2021-01-09 – 2021-01-10 (×2): 5 mg via ORAL
  Filled 2021-01-09 (×2): qty 1

## 2021-01-09 NOTE — TOC CAGE-AID Note (Signed)
Transition of Care [facility]) - CAGE-AID Screening   Patient Details  Name: [name] MRN: [id number] Date of Birth: 1975-11-17  Transition of Care [facility]) CM/SW Contact:    Kermit Balo, RN Phone Number: 01/09/2021, 11:29 AM   Clinical Narrative: Pt admits to substance abuse but says she has cut back in the past and can do it again. Pt refused inpatient/ outpatient drug/ alcohol resources.     CAGE-AID Screening:    Have You Ever Felt You Ought to Cut Down on Your Drinking or Drug Use?: Yes Have People Annoyed You By Critizing Your Drinking Or Drug Use?: No Have You Felt Bad Or Guilty About Your Drinking Or Drug Use?: No Have You Ever Had a Drink or Used Drugs First Thing In The Morning to Steady Your Nerves or to Get Rid of a Hangover?: No CAGE-AID Score: 1  Substance Abuse Education Offered: Yes (pt refused)

## 2021-01-09 NOTE — Progress Notes (Addendum)
PROGRESS NOTE    [name]  FMB:[id number] DOB: 1976/04/06 DOA: 01/08/2021 PCP: Patient, No Pcp Per (Inactive)   Brief Narrative:  [name] is a 45 y.o. female past medical history significant for alcohol dependence, depression, drug use disorder (cocaine),  She was seen by her PCP recently lab work was obtain. She was referred by her PCP to the ED due to low hemoglobin.  Patient report feeling weak and tired for the last month, symptom has been getting worse recently.  She denies melena, hematochezia or hematemesis.  She report heavy menstrual.  She changed tampons very frequently (10 per day). Her last menstrual period was last week. She is not bleeding currently.    She drinks alcohol, she didn't want to specify how much she drinks.    Evaluation  in the ED: Hemoglobin 3.8--repeated 5.8.  Normal liver function test normal kidney function.  UDS positive for cocaine.  Alcohol level 225.  HIV nonreactive.   Assessment & Plan:   Active Problems:   Alcohol dependence (HCC)   Anemia   Severe anemia   Menorrhagia  1-Severe Anemia;  Likely iron deficiency in setting of menorrhagia.  No evidence of GI bleed.  Hb on admission 3.8--5.8.  Anemia panel: Iron 26, Ferritin: 2. Received 2 unit PRBC. Plan for 2 more units PRBC today , Hb at 6.  Plan for IV iron tomorrow.      2-Menorrhagia;  TSH: 0.621 Transvaginal US> Endometrial stripe within normal limits measuring 8.3 mm.. 3.7 cm simple left ovarian cyst, almost certainly benign in nature given size and appearance. No follow up imaging recommended. Note: This recommendation does not apply to premenarchal patients or to those with increased risk (genetic, family history, elevated) tumor markers or other high-risk factors) of ovarian cancer.  2.1 cm dominant right ovarian follicle. -Discussed with GYN on call. Plan to start Megace. They will arrange follow up.    3-Alcohol use;  Continue with thiamine and folic acid.  CIWA  protocol. CIWA score 4.  Counseling provided.       4-HTN; BP has been elevated. Plan to start low dose norvasc.       Estimated body mass index is 24.12 kg/m as calculated from the following:   Height as of this encounter: 5\' 7"  (1.702 m).   Weight as of this encounter: 69.9 kg.   DVT prophylaxis: SCD Code Status: Full Code Family Communication: care discussed with patient Disposition Plan:  Status is: Observation  The patient will require care spanning > 2 midnights and should be moved to inpatient because: IV treatments appropriate due to intensity of illness or inability to take PO  Dispo: The patient is from: Home              Anticipated d/c is to: Home              Patient currently is not medically stable to d/c.   Difficult to place patient No        Consultants:  SW  Procedures:   Antimicrobials:    Subjective: She denies  bleeding. We discussed again about alcohol cessation.   Objective: Vitals:   01/08/21 2207 01/08/21 2326 01/08/21 2342 01/09/21 0245  BP: (!) 141/68 (!) 144/77 (!) 154/71 134/89  Pulse: 99 91 100 99  Resp: 20 16 20 18   Temp: 98.5 F (36.9 C) 98.4 F (36.9 C) (!) 97.4 F (36.3 C) 98.1 F (36.7 C)  TempSrc: Oral Oral Axillary Oral  SpO2: 100% 100% 100% 100%  Weight:      Height:        Intake/Output Summary (Last 24 hours) at 01/09/2021 0714 Last data filed at 01/09/2021 0300 Gross per 24 hour  Intake 2028.66 ml  Output --  Net 2028.66 ml   Filed Weights   01/08/21 1457  Weight: 69.9 kg    Examination:  General exam: Appears calm and comfortable  Respiratory system: Clear to auscultation. Respiratory effort normal. Cardiovascular system: S1 & S2 heard, RRR. No JVD, murmurs, rubs, gallops or clicks. No pedal edema. Gastrointestinal system: Abdomen is nondistended, soft and nontender. No organomegaly or masses felt. Normal bowel sounds heard. Central nervous system: Alert and oriented.. Extremities: Symmetric  5 x 5 power.   Data Reviewed: I have personally reviewed following labs and imaging studies  CBC: Recent Labs  Lab 01/08/21 1403 01/08/21 1411 01/08/21 2253 01/09/21 0602  WBC 4.8  --  5.0  5.0 5.1  NEUTROABS 2.7  --   --   --   HGB 3.8* 5.8* 4.9*  4.6* 6.2*  HCT 15.4* 17.0* 17.3*  17.0* 20.7*  MCV 61.4*  --  62.0*  63.0* 65.7*  PLT 347  --  314  325 291   Basic Metabolic Panel: Recent Labs  Lab 01/08/21 1403 01/08/21 1411 01/08/21 2253  NA 137 139  --   K 4.0 4.0  --   CL 105 106  --   CO2 22  --   --   GLUCOSE 83 83  --   BUN 10 10  --   CREATININE 0.64 0.90  --   CALCIUM 8.8*  --   --   MG  --   --  2.1  PHOS  --   --  5.0*   GFR: Estimated Creatinine Clearance: 78.4 mL/min (by C-G formula based on SCr of 0.9 mg/dL). Liver Function Tests: Recent Labs  Lab 01/08/21 1403  AST 32  ALT 19  ALKPHOS 39  BILITOT 0.3  PROT 7.4  ALBUMIN 3.9   No results for input(s): LIPASE, AMYLASE in the last 168 hours. No results for input(s): AMMONIA in the last 168 hours. Coagulation Profile: Recent Labs  Lab 01/08/21 2253  INR 1.1   Cardiac Enzymes: No results for input(s): CKTOTAL, CKMB, CKMBINDEX, TROPONINI in the last 168 hours. BNP (last 3 results) No results for input(s): PROBNP in the last 8760 hours. HbA1C: No results for input(s): HGBA1C in the last 72 hours. CBG: Recent Labs  Lab 01/08/21 1403  GLUCAP 76   Lipid Profile: No results for input(s): CHOL, HDL, LDLCALC, TRIG, CHOLHDL, LDLDIRECT in the last 72 hours. Thyroid Function Tests: Recent Labs    01/08/21 2253  TSH 0.621   Anemia Panel: Recent Labs    01/08/21 2253  VITAMINB12 601  FOLATE 13.3  FERRITIN 2*  TIBC 620*  IRON 26*  RETICCTPCT RESULTS UNAVAILABLE DUE TO INTERFERING SUBSTANCE   Sepsis Labs: No results for input(s): PROCALCITON, LATICACIDVEN in the last 168 hours.  Recent Results (from the past 240 hour(s))  Resp Panel by RT-PCR (Flu A&B, Covid) Nasopharyngeal Swab      Status: None   Collection Time: 01/08/21  4:12 PM   Specimen: Nasopharyngeal Swab; Nasopharyngeal(NP) swabs in vial transport medium  Result Value Ref Range Status   SARS Coronavirus 2 by RT PCR NEGATIVE NEGATIVE Final    Comment: (NOTE) SARS-CoV-2 target nucleic acids are NOT DETECTED.  The SARS-CoV-2 RNA is generally detectable in upper respiratory  specimens during the acute phase of infection. The lowest concentration of SARS-CoV-2 viral copies this assay can detect is 138 copies/mL. A negative result does not preclude SARS-Cov-2 infection and should not be used as the sole basis for treatment or other patient management decisions. A negative result may occur with  improper specimen collection/handling, submission of specimen other than nasopharyngeal swab, presence of viral mutation(s) within the areas targeted by this assay, and inadequate number of viral copies(<138 copies/mL). A negative result must be combined with clinical observations, patient history, and epidemiological information. The expected result is Negative.  Fact Sheet for Patients:  BloggerCourse.com  Fact Sheet for Healthcare Providers:  SeriousBroker.it  This test is no t yet approved or cleared by the [place] FDA and  has been authorized for detection and/or diagnosis of SARS-CoV-2 by FDA under an Emergency Use Authorization (EUA). This EUA will remain  in effect (meaning this test can be used) for the duration of the COVID-19 declaration under Section 564(b)(1) of the Act, 21 U.S.C.section 360bbb-3(b)(1), unless the authorization is terminated  or revoked sooner.       Influenza A by PCR NEGATIVE NEGATIVE Final   Influenza B by PCR NEGATIVE NEGATIVE Final    Comment: (NOTE) The Xpert Xpress SARS-CoV-2/FLU/RSV plus assay is intended as an aid in the diagnosis of influenza from Nasopharyngeal swab specimens and should not be used as a sole basis  for treatment. Nasal washings and aspirates are unacceptable for Xpert Xpress SARS-CoV-2/FLU/RSV testing.  Fact Sheet for Patients: BloggerCourse.com  Fact Sheet for Healthcare Providers: SeriousBroker.it  This test is not yet approved or cleared by the [place] FDA and has been authorized for detection and/or diagnosis of SARS-CoV-2 by FDA under an Emergency Use Authorization (EUA). This EUA will remain in effect (meaning this test can be used) for the duration of the COVID-19 declaration under Section 564(b)(1) of the Act, 21 U.S.C. section 360bbb-3(b)(1), unless the authorization is terminated or revoked.  Performed at [facility], [address], [place], [place] [address]          Radiology Studies: US PELVIS TRANSVAGINAL NON-OB (TV ONLY)  Result Date: 01/08/2021 CLINICAL DATA:  Initial evaluation for menorrhagia. EXAM: ULTRASOUND PELVIS TRANSVAGINAL TECHNIQUE: Transvaginal ultrasound examination of the pelvis was performed including evaluation of the uterus, ovaries, adnexal regions, and pelvic cul-de-sac. COMPARISON:  None. FINDINGS: Uterus Measurements: 9.0 x 5.2 x 5.9 cm = volume: 143.9 mL. Uterus is anteverted. No discrete fibroid or other mass. C-section scar noted. Endometrium Thickness: 8.3 mm.  No focal abnormality visualized. Right ovary Measurements: 3.1 x 2.9 x 3.2 cm = volume: 14.8 mL. 2.1 x 2.0 x 2.0 cm dominant follicle noted. No other adnexal mass. Left ovary Measurements: 4.2 x 3.4 x 4.3 cm = volume: 32.6 mL. 3.6 x 3.0 x 3.7 cm simple cyst. No significant internal complexity. No vascularity or solid nodularity. No other adnexal mass. Other findings:  No abnormal free fluid IMPRESSION: 1. Endometrial stripe within normal limits measuring 8.3 mm. If bleeding remains unresponsive to hormonal or medical therapy, sonohysterogram should be considered for focal lesion work-up. (Ref: Radiological Reasoning:  Algorithmic Workup of Abnormal Vaginal Bleeding with Endovaginal Sonography and Sonohysterography. AJR 2008; 735:H29-92). 2. Otherwise normal sonographic appearance of the uterus. 3. 3.7 cm simple left ovarian cyst, almost certainly benign in nature given size and appearance. No follow up imaging recommended. Note: This recommendation does not apply to premenarchal patients or to those with increased risk (genetic, family history, elevated  tumor markers or other high-risk factors) of ovarian cancer. Reference: Radiology 2019 Nov; 293(2):359-371. 4. 2.1 cm dominant right ovarian follicle.  No free fluid. Electronically Signed   By: Rise MuBenjamin  McClintock M.D.   On: 01/08/2021 18:43        Scheduled Meds:  sodium chloride   Intravenous Once   sodium chloride   Intravenous Once   folic acid  1 mg Oral Daily   LORazepam  0-4 mg Intravenous Q6H   Followed by   Melene Muller[START ON 01/10/2021] LORazepam  0-4 mg Intravenous Q12H   multivitamin with minerals  1 tablet Oral Daily   nicotine  14 mg Transdermal Daily   thiamine  100 mg Oral Daily   Or   thiamine  100 mg Intravenous Daily   Continuous Infusions:  sodium chloride 75 mL/hr at 01/09/21 0300     LOS: 0 days    Time spent: 35 minutes    Bertha Earwood A Sotero Brinkmeyer, MD [facility]   If 7PM-7AM, please contact night-coverage [email or web address]  01/09/2021, 7:14 AM

## 2021-01-09 NOTE — TOC Initial Note (Signed)
Transition of Care [facility]) - Initial/Assessment Note    Patient Details  Name: [name] MRN: [id number] Date of Birth: January 02, 1976  Transition [facility]) CM/SW Contact:    Kermit Balo, RN Phone Number: 01/09/2021, 11:33 AM  Clinical Narrative:                 Patient states she lives with a roommate and  her 43 year old son.  Pt was not taking any medications prior to the hospital. She denies issues with transportation. Information for PCP on AVS.  TOC following.  Expected Discharge Plan: Home/Self Care Barriers to Discharge: Continued Medical Work up   Patient Goals and CMS Choice        Expected Discharge Plan and Services Expected Discharge Plan: Home/Self Care   Discharge Planning Services: CM Consult   Living arrangements for the past 2 months: Apartment                                      Prior Living Arrangements/Services Living arrangements for the past 2 months: Apartment Lives with:: Roommate, Minor Children Patient language and need for interpreter reviewed:: Yes Do you feel safe going back to the place where you live?: Yes            Criminal Activity/Legal Involvement Pertinent to Current Situation/Hospitalization: No - Comment as needed  Activities of Daily Living Home Assistive Devices/Equipment: None ADL Screening (condition at time of admission) Patient's cognitive ability adequate to safely complete daily activities?: Yes Is the patient deaf or have difficulty hearing?: No Does the patient have difficulty seeing, even when wearing glasses/contacts?: No Does the patient have difficulty concentrating, remembering, or making decisions?: No Patient able to express need for assistance with ADLs?: Yes Does the patient have difficulty dressing or bathing?: No Independently performs ADLs?: Yes (appropriate for developmental age) Does the patient have difficulty walking or climbing stairs?: No Weakness of Legs: None Weakness of  Arms/Hands: None  Permission Sought/Granted                  Emotional Assessment Appearance:: Appears stated age Attitude/Demeanor/Rapport: Suspicious Affect (typically observed): Agitated Orientation: : Oriented to Self, Oriented to Place, Oriented to  Time, Oriented to Situation Alcohol / Substance Use: Alcohol Use, Illicit Drugs Psych Involvement: No (comment)  Admission diagnosis:  Menorrhagia [N92.0] Anemia [D64.9] Alcoholic intoxication with complication (HCC) [F10.929] Severe anemia [D64.9] Patient Active Problem List   Diagnosis Date Noted   Anemia 01/08/2021   Severe anemia 01/08/2021   Menorrhagia 01/08/2021   Cervical high risk HPV (human papillomavirus) test positive 09/03/2012   AMA (advanced maternal age) multigravida 35+ 08/21/2012   Supervision of normal intrauterine pregnancy in multigravida 08/21/2012   Tobacco smoking complicating pregnancy 08/21/2012   Maternal alcohol use complicating pregnancy, antepartum 08/21/2012   Alcohol dependence (HCC) 06/21/2012   Alcohol withdrawal (HCC) 06/21/2012   PCP:  Patient, No Pcp Per (Inactive) Pharmacy:   [facility] [phone] - [place], [place] - [address] AT [facility] [address] [place] [address] Phone: [phone] Fax: [phone]     Social Determinants of Health (SDOH) Interventions    Readmission Risk Interventions No flowsheet data found.

## 2021-01-10 DIAGNOSIS — D649 Anemia, unspecified: Secondary | ICD-10-CM | POA: Diagnosis not present

## 2021-01-10 LAB — TYPE AND SCREEN
ABO/RH(D): A POS
Antibody Screen: NEGATIVE
Unit division: 0
Unit division: 0
Unit division: 0
Unit division: 0

## 2021-01-10 LAB — BPAM RBC
Blood Product Expiration Date: 202209032359
Blood Product Expiration Date: 202209072359
Blood Product Expiration Date: 202209072359
Blood Product Expiration Date: 202209072359
ISSUE DATE / TIME: 202208111741
ISSUE DATE / TIME: 202208112317
ISSUE DATE / TIME: 202208121055
ISSUE DATE / TIME: 202208121646
Unit Type and Rh: 6200
Unit Type and Rh: 6200
Unit Type and Rh: 6200
Unit Type and Rh: 6200

## 2021-01-10 LAB — HAPTOGLOBIN: Haptoglobin: 97 mg/dL (ref 42–296)

## 2021-01-10 MED ORDER — SODIUM CHLORIDE 0.9 % IV SOLN
510.0000 mg | Freq: Once | INTRAVENOUS | Status: AC
  Administered 2021-01-10: 510 mg via INTRAVENOUS
  Filled 2021-01-10: qty 17

## 2021-01-10 MED ORDER — ACETAMINOPHEN 325 MG PO TABS: 650.0000 mg | ORAL_TABLET | Freq: Four times a day (QID) | ORAL | 0 refills | Status: AC | PRN

## 2021-01-10 MED ORDER — FOLIC ACID 1 MG PO TABS: 1.0000 mg | ORAL_TABLET | Freq: Every day | ORAL | 0 refills | Status: AC

## 2021-01-10 MED ORDER — ADULT MULTIVITAMIN W/MINERALS CH: 1.0000 | ORAL_TABLET | Freq: Every day | ORAL | 0 refills | Status: AC

## 2021-01-10 MED ORDER — AMLODIPINE BESYLATE 5 MG PO TABS: 5.0000 mg | ORAL_TABLET | Freq: Every day | ORAL | 0 refills | Status: AC

## 2021-01-10 MED ORDER — FERROUS SULFATE 325 (65 FE) MG PO TABS: 325.0000 mg | ORAL_TABLET | Freq: Every day | ORAL | 3 refills | Status: AC

## 2021-01-10 MED ORDER — MEGESTROL ACETATE 40 MG PO TABS: 40.0000 mg | ORAL_TABLET | Freq: Every day | ORAL | 1 refills | Status: AC

## 2021-01-10 NOTE — Discharge Summary (Signed)
Physician Discharge Summary  [name]:[id number] DOB: 06/27/1975 DOA: 01/08/2021  PCP: Patient, No Pcp Per (Inactive)  Admit date: 01/08/2021 Discharge date: 01/10/2021  Admitted From: Home  Disposition:  Home   Recommendations for Outpatient Follow-up:  Follow up with PCP in 1-2 weeks Please obtain BMP/CBC in one week Needs to follow up with GYN Needs repeat CBC.  Needs further assessment of BP    Discharge Condition: Stable.  CODE STATUS: Full code Diet recommendation: Heart Healthy   Brief/Interim Summary: Erin HELEENA Richardson is a 45 y.o. female past medical history significant for alcohol dependence, depression, drug use disorder (cocaine),  She was seen by her PCP recently lab work was obtain. She was referred by her PCP to the ED due to low hemoglobin.  Patient report feeling weak and tired for the last month, symptom has been getting worse recently.  She denies melena, hematochezia or hematemesis.  She report heavy menstrual.  She changed tampons very frequently (10 per day). Her last menstrual period was last week. She is not bleeding currently.    She drinks alcohol, she didn't want to specify how much she drinks.    Evaluation  in the ED: Hemoglobin 3.8--repeated 5.8.  Normal liver function test normal kidney function.  UDS positive for cocaine.  Alcohol level 225.  HIV nonreactive.   1-Severe Anemia;  Likely iron deficiency in setting of menorrhagia.  No evidence of GI bleed.  Hb on admission 3.8--5.8.  Anemia panel: Iron 26, Ferritin: 2. Received total 4 unit PRBC.  Received IV iron.  Hb increase to 8.  Discharge on oral iron.   2-Menorrhagia;  TSH: 0.621 Transvaginal US> Endometrial stripe within normal limits measuring 8.3 mm.. 3.7 cm simple left ovarian cyst, almost certainly benign in nature given size and appearance. No follow up imaging recommended. Note: This recommendation does not apply to premenarchal patients or to those with increased risk  (genetic, family history, elevated) tumor markers or other high-risk factors) of ovarian cancer.  2.1 cm dominant right ovarian follicle. -Discussed with GYN on call. Started  Megace. They will arrange follow up.   needs follow up with GYN   3-Alcohol use;  Continue with thiamine and folic acid.  CIWA protocol. CIWA score 0.  Counseling provided.      4-HTN; BP has been elevated. Started low dose norvasc. Further adjustment out patient           Discharge Diagnoses:  Active Problems:   Alcohol dependence (HCC)   Anemia   Severe anemia   Menorrhagia    Discharge Instructions  Discharge Instructions     Diet - low sodium heart healthy   Complete by: As directed    Increase activity slowly   Complete by: As directed       Allergies as of 01/10/2021   No Known Allergies      Medication List     STOP taking these medications    BC HEADACHE POWDER PO   ibuprofen 800 MG tablet Commonly known as: ADVIL   naproxen sodium 550 MG tablet Commonly known as: Anaprox DS   permethrin 5 % cream Commonly known as: ELIMITE       TAKE these medications    acetaminophen 325 MG tablet Commonly known as: TYLENOL Take 2 tablets (650 mg total) by mouth every 6 (six) hours as needed for mild pain (or Fever >/= 101).   amLODipine 5 MG tablet Commonly known as: NORVASC Take 1 tablet (5 mg total)  by mouth daily. Start taking on: January 11, 2021   ferrous sulfate 325 (65 FE) MG tablet Take 1 tablet (325 mg total) by mouth daily.   folic acid 1 MG tablet Commonly known as: FOLVITE Take 1 tablet (1 mg total) by mouth daily. Start taking on: January 11, 2021   megestrol 40 MG tablet Commonly known as: MEGACE Take 1 tablet (40 mg total) by mouth daily. Start taking on: January 11, 2021   multivitamin with minerals Tabs tablet Take 1 tablet by mouth daily. Start taking on: January 11, 2021        Follow-up Information     Inc, [facility].  Schedule an appointment as soon as possible for a visit in 2 week(s).   Specialty: Pediatrics Contact information: [address] [place] [address] [phone]                No Known Allergies  Consultations: GYN   Procedures/Studies: US PELVIS TRANSVAGINAL NON-OB (TV ONLY)  Result Date: 01/08/2021 CLINICAL DATA:  Initial evaluation for menorrhagia. EXAM: ULTRASOUND PELVIS TRANSVAGINAL TECHNIQUE: Transvaginal ultrasound examination of the pelvis was performed including evaluation of the uterus, ovaries, adnexal regions, and pelvic cul-de-sac. COMPARISON:  None. FINDINGS: Uterus Measurements: 9.0 x 5.2 x 5.9 cm = volume: 143.9 mL. Uterus is anteverted. No discrete fibroid or other mass. C-section scar noted. Endometrium Thickness: 8.3 mm.  No focal abnormality visualized. Right ovary Measurements: 3.1 x 2.9 x 3.2 cm = volume: 14.8 mL. 2.1 x 2.0 x 2.0 cm dominant follicle noted. No other adnexal mass. Left ovary Measurements: 4.2 x 3.4 x 4.3 cm = volume: 32.6 mL. 3.6 x 3.0 x 3.7 cm simple cyst. No significant internal complexity. No vascularity or solid nodularity. No other adnexal mass. Other findings:  No abnormal free fluid IMPRESSION: 1. Endometrial stripe within normal limits measuring 8.3 mm. If bleeding remains unresponsive to hormonal or medical therapy, sonohysterogram should be considered for focal lesion work-up. (Ref: Radiological Reasoning: Algorithmic Workup of Abnormal Vaginal Bleeding with Endovaginal Sonography and Sonohysterography. AJR 2008; 546:F68-12). 2. Otherwise normal sonographic appearance of the uterus. 3. 3.7 cm simple left ovarian cyst, almost certainly benign in nature given size and appearance. No follow up imaging recommended. Note: This recommendation does not apply to premenarchal patients or to those with increased risk (genetic, family history, elevated tumor markers or other high-risk factors) of ovarian cancer. Reference: Radiology 2019 Nov;  293(2):359-371. 4. 2.1 cm dominant right ovarian follicle.  No free fluid. Electronically Signed   By: Rise Mu M.D.   On: 01/08/2021 18:43     Subjective: She is feeling well.    Discharge Exam: Vitals:   01/10/21 0747 01/10/21 1148  BP: (!) 140/95 (!) 155/97  Pulse: 91 72  Resp: 18 19  Temp: 97.8 F (36.6 C) 98.1 F (36.7 C)  SpO2: 98% 100%     General: Pt is alert, awake, not in acute distress Cardiovascular: RRR, S1/S2 +, no rubs, no gallops Respiratory: CTA bilaterally, no wheezing, no rhonchi Abdominal: Soft, NT, ND, bowel sounds + Extremities: no edema, no cyanosis    The results of significant diagnostics from this hospitalization (including imaging, microbiology, ancillary and laboratory) are listed below for reference.     Microbiology: Recent Results (from the past 240 hour(s))  Resp Panel by RT-PCR (Flu A&B, Covid) Nasopharyngeal Swab     Status: None   Collection Time: 01/08/21  4:12 PM   Specimen: Nasopharyngeal Swab; Nasopharyngeal(NP) swabs in  vial transport medium  Result Value Ref Range Status   SARS Coronavirus 2 by RT PCR NEGATIVE NEGATIVE Final    Comment: (NOTE) SARS-CoV-2 target nucleic acids are NOT DETECTED.  The SARS-CoV-2 RNA is generally detectable in upper respiratory specimens during the acute phase of infection. The lowest concentration of SARS-CoV-2 viral copies this assay can detect is 138 copies/mL. A negative result does not preclude SARS-Cov-2 infection and should not be used as the sole basis for treatment or other patient management decisions. A negative result may occur with  improper specimen collection/handling, submission of specimen other than nasopharyngeal swab, presence of viral mutation(s) within the areas targeted by this assay, and inadequate number of viral copies(<138 copies/mL). A negative result must be combined with clinical observations, patient history, and epidemiological information. The expected  result is Negative.  Fact Sheet for Patients:  BloggerCourse.comhttps://www.fda.gov/media/152166/download  Fact Sheet for Healthcare Providers:  SeriousBroker.ithttps://www.fda.gov/media/152162/download  This test is no t yet approved or cleared by the [place] FDA and  has been authorized for detection and/or diagnosis of SARS-CoV-2 by FDA under an Emergency Use Authorization (EUA). This EUA will remain  in effect (meaning this test can be used) for the duration of the COVID-19 declaration under Section 564(b)(1) of the Act, 21 U.S.C.section 360bbb-3(b)(1), unless the authorization is terminated  or revoked sooner.       Influenza A by PCR NEGATIVE NEGATIVE Final   Influenza B by PCR NEGATIVE NEGATIVE Final    Comment: (NOTE) The Xpert Xpress SARS-CoV-2/FLU/RSV plus assay is intended as an aid in the diagnosis of influenza from Nasopharyngeal swab specimens and should not be used as a sole basis for treatment. Nasal washings and aspirates are unacceptable for Xpert Xpress SARS-CoV-2/FLU/RSV testing.  Fact Sheet for Patients: BloggerCourse.comhttps://www.fda.gov/media/152166/download  Fact Sheet for Healthcare Providers: SeriousBroker.ithttps://www.fda.gov/media/152162/download  This test is not yet approved or cleared by the [place] FDA and has been authorized for detection and/or diagnosis of SARS-CoV-2 by FDA under an Emergency Use Authorization (EUA). This EUA will remain in effect (meaning this test can be used) for the duration of the COVID-19 declaration under Section 564(b)(1) of the Act, 21 U.S.C. section 360bbb-3(b)(1), unless the authorization is terminated or revoked.  Performed at [facility], [address] [place], [place] [id number]      Labs: BNP (last 3 results) No results for input(s): BNP in the last 8760 hours. Basic Metabolic Panel: Recent Labs  Lab 01/08/21 1403 01/08/21 1411 01/08/21 2253  NA 137 139  --   K 4.0 4.0  --   CL 105 106  --   CO2 22  --   --   GLUCOSE 83 83  --   BUN  10 10  --   CREATININE 0.64 0.90  --   CALCIUM 8.8*  --   --   MG  --   --  2.1  PHOS  --   --  5.0*   Liver Function Tests: Recent Labs  Lab 01/08/21 1403  AST 32  ALT 19  ALKPHOS 39  BILITOT 0.3  PROT 7.4  ALBUMIN 3.9   No results for input(s): LIPASE, AMYLASE in the last 168 hours. No results for input(s): AMMONIA in the last 168 hours. CBC: Recent Labs  Lab 01/08/21 1403 01/08/21 1411 01/08/21 2253 01/09/21 0602 01/09/21 2201  WBC 4.8  --  5.0  5.0 5.1 6.0  NEUTROABS 2.7  --   --   --   --   HGB  3.8* 5.8* 4.9*  4.6* 6.2* 8.3*  HCT 15.4* 17.0* 17.3*  17.0* 20.7* 28.0*  MCV 61.4*  --  62.0*  63.0* 65.7* 72.2*  PLT 347  --  314  325 291 277   Cardiac Enzymes: No results for input(s): CKTOTAL, CKMB, CKMBINDEX, TROPONINI in the last 168 hours. BNP: Invalid input(s): POCBNP CBG: Recent Labs  Lab 01/08/21 1403  GLUCAP 76   D-Dimer No results for input(s): DDIMER in the last 72 hours. Hgb A1c No results for input(s): HGBA1C in the last 72 hours. Lipid Profile No results for input(s): CHOL, HDL, LDLCALC, TRIG, CHOLHDL, LDLDIRECT in the last 72 hours. Thyroid function studies Recent Labs    01/08/21 2253  TSH 0.621   Anemia work up Recent Labs    01/08/21 2253  VITAMINB12 601  FOLATE 13.3  FERRITIN 2*  TIBC 620*  IRON 26*  RETICCTPCT RESULTS UNAVAILABLE DUE TO INTERFERING SUBSTANCE   Urinalysis    Component Value Date/Time   COLORURINE STRAW (A) 01/08/2021 1423   APPEARANCEUR HAZY (A) 01/08/2021 1423   LABSPEC 1.003 (L) 01/08/2021 1423   PHURINE 6.0 01/08/2021 1423   GLUCOSEU NEGATIVE 01/08/2021 1423   HGBUR NEGATIVE 01/08/2021 1423   BILIRUBINUR NEGATIVE 01/08/2021 1423   KETONESUR NEGATIVE 01/08/2021 1423   PROTEINUR NEGATIVE 01/08/2021 1423   UROBILINOGEN 0.2 04/19/2014 2020   NITRITE NEGATIVE 01/08/2021 1423   LEUKOCYTESUR NEGATIVE 01/08/2021 1423   Sepsis Labs Invalid input(s): PROCALCITONIN,  WBC,   LACTICIDVEN Microbiology Recent Results (from the past 240 hour(s))  Resp Panel by RT-PCR (Flu A&B, Covid) Nasopharyngeal Swab     Status: None   Collection Time: 01/08/21  4:12 PM   Specimen: Nasopharyngeal Swab; Nasopharyngeal(NP) swabs in vial transport medium  Result Value Ref Range Status   SARS Coronavirus 2 by RT PCR NEGATIVE NEGATIVE Final    Comment: (NOTE) SARS-CoV-2 target nucleic acids are NOT DETECTED.  The SARS-CoV-2 RNA is generally detectable in upper respiratory specimens during the acute phase of infection. The lowest concentration of SARS-CoV-2 viral copies this assay can detect is 138 copies/mL. A negative result does not preclude SARS-Cov-2 infection and should not be used as the sole basis for treatment or other patient management decisions. A negative result may occur with  improper specimen collection/handling, submission of specimen other than nasopharyngeal swab, presence of viral mutation(s) within the areas targeted by this assay, and inadequate number of viral copies(<138 copies/mL). A negative result must be combined with clinical observations, patient history, and epidemiological information. The expected result is Negative.  Fact Sheet for Patients:  BloggerCourse.com  Fact Sheet for Healthcare Providers:  SeriousBroker.it  This test is no t yet approved or cleared by the [place] FDA and  has been authorized for detection and/or diagnosis of SARS-CoV-2 by FDA under an Emergency Use Authorization (EUA). This EUA will remain  in effect (meaning this test can be used) for the duration of the COVID-19 declaration under Section 564(b)(1) of the Act, 21 U.S.C.section 360bbb-3(b)(1), unless the authorization is terminated  or revoked sooner.       Influenza A by PCR NEGATIVE NEGATIVE Final   Influenza B by PCR NEGATIVE NEGATIVE Final    Comment: (NOTE) The Xpert Xpress SARS-CoV-2/FLU/RSV plus  assay is intended as an aid in the diagnosis of influenza from Nasopharyngeal swab specimens and should not be used as a sole basis for treatment. Nasal washings and aspirates are unacceptable for Xpert Xpress SARS-CoV-2/FLU/RSV testing.  Fact Sheet for Patients: BloggerCourse.com  Fact Sheet for Healthcare Providers: SeriousBroker.it  This test is not yet approved or cleared by the [place] FDA and has been authorized for detection and/or diagnosis of SARS-CoV-2 by FDA under an Emergency Use Authorization (EUA). This EUA will remain in effect (meaning this test can be used) for the duration of the COVID-19 declaration under Section 564(b)(1) of the Act, 21 U.S.C. section 360bbb-3(b)(1), unless the authorization is terminated or revoked.  Performed at [facility], [address], [place], [place] [address]      Time coordinating discharge: 40 minutes  SIGNED:   Alba Cory, MD  [facility]

## 2021-01-29 ENCOUNTER — Encounter: Payer: Medicaid Other | Admitting: Obstetrics and Gynecology

## 2022-07-15 ENCOUNTER — Other Ambulatory Visit: Payer: Self-pay | Admitting: Nurse Practitioner

## 2022-07-15 DIAGNOSIS — Z1231 Encounter for screening mammogram for malignant neoplasm of breast: Secondary | ICD-10-CM

## 2023-11-01 ENCOUNTER — Other Ambulatory Visit: Payer: Self-pay | Admitting: Nurse Practitioner

## 2023-11-01 DIAGNOSIS — N644 Mastodynia: Secondary | ICD-10-CM

## 2023-12-15 ENCOUNTER — Emergency Department (HOSPITAL_COMMUNITY)

## 2023-12-15 ENCOUNTER — Other Ambulatory Visit: Payer: Self-pay

## 2023-12-15 ENCOUNTER — Encounter (HOSPITAL_COMMUNITY): Payer: Self-pay

## 2023-12-15 ENCOUNTER — Emergency Department (HOSPITAL_COMMUNITY)
Admission: EM | Admit: 2023-12-15 | Discharge: 2023-12-15 | Discharge status: Against Medical Advice | Attending: Emergency Medicine | Admitting: Emergency Medicine

## 2023-12-15 DIAGNOSIS — R079 Chest pain, unspecified: Secondary | ICD-10-CM | POA: Diagnosis present

## 2023-12-15 DIAGNOSIS — R0602 Shortness of breath: Secondary | ICD-10-CM | POA: Diagnosis not present

## 2023-12-15 DIAGNOSIS — Z5321 Procedure and treatment not carried out due to patient leaving prior to being seen by health care provider: Secondary | ICD-10-CM | POA: Diagnosis not present

## 2023-12-15 LAB — ETHANOL: Alcohol, Ethyl (B): 267 mg/dL — ABNORMAL HIGH (ref <15)

## 2023-12-15 LAB — CBC
HCT: 35.3 % — ABNORMAL LOW (ref 36.0–46.0)
Hemoglobin: 12 g/dL (ref 12.0–15.0)
MCH: 29.1 pg (ref 26.0–34.0)
MCHC: 34 g/dL (ref 30.0–36.0)
MCV: 85.5 fL (ref 80.0–100.0)
Platelets: 122 K/uL — ABNORMAL LOW (ref 150–400)
RBC: 4.13 MIL/uL (ref 3.87–5.11)
RDW: 17.4 % — ABNORMAL HIGH (ref 11.5–15.5)
WBC: 3.6 K/uL — ABNORMAL LOW (ref 4.0–10.5)
nRBC: 0 % (ref 0.0–0.2)

## 2023-12-15 LAB — BASIC METABOLIC PANEL WITH GFR
Anion gap: 12 (ref 5–15)
BUN: 8 mg/dL (ref 6–20)
CO2: 20 mmol/L — ABNORMAL LOW (ref 22–32)
Calcium: 8.8 mg/dL — ABNORMAL LOW (ref 8.9–10.3)
Chloride: 108 mmol/L (ref 98–111)
Creatinine, Ser: 0.47 mg/dL (ref 0.44–1.00)
GFR, Estimated: >60 mL/min (ref >60)
Glucose, Bld: 84 mg/dL (ref 70–99)
Potassium: 3.9 mmol/L (ref 3.5–5.1)
Sodium: 140 mmol/L (ref 135–145)

## 2023-12-15 LAB — TROPONIN I (HIGH SENSITIVITY)
Troponin I (High Sensitivity): 3 ng/L (ref <18)
Troponin I (High Sensitivity): 3 ng/L (ref <18)

## 2023-12-15 LAB — HCG, SERUM, QUALITATIVE: Preg, Serum: NEGATIVE

## 2023-12-15 NOTE — ED Provider Triage Note (Signed)
 Emergency Medicine Provider Triage Evaluation Note  [name] , a 48 y.o. female  was evaluated in triage.  Pt complains of chest pain and shortness of breath that began this evening.  She is unsure of an exertional component to her chest pain.  She denies nausea, vomiting or abdominal pain.  She is clinically intoxicated on examination.  She is unsure of alleviating or aggravating factors.  Review of Systems  Positive:  Negative:   Physical Exam  BP 119/75 (BP Location: Right Arm)   Pulse (!) 102   Temp 98.4 F (36.9 C) (Oral)   Resp 16   Ht 5' 7 (1.702 m)   Wt 70 kg   LMP 08/30/2023 (Approximate)   SpO2 99%   BMI 24.17 kg/m  Gen:   Awake, no distress   Resp:  Normal effort  MSK:   Moves extremities without difficulty  Other:    Medical Decision Making  Medically screening exam initiated at 4:06 AM.  Appropriate orders placed.  Ashyia O Cristo was informed that the remainder of the evaluation will be completed by another provider, this initial triage assessment does not replace that evaluation, and the importance of remaining in the ED until their evaluation is complete.     Ruthell Lonni FALCON, PA-C 12/15/23 0407

## 2023-12-15 NOTE — ED Triage Notes (Signed)
 Pt to ED via [facility] from home. Pt c/o chest pain x 4-5hrs. Pt took antacid w/o relief. Pt received ASA 324mg , ntg x 1 w/EMS. Pt has had SHOB.   126/94 HR 96 98% RA 18g LAC NS

## 2023-12-15 NOTE — ED Notes (Signed)
 Patient angry and wanting to leave reports she has waited long enough.  Chris NT removed IV and patient left with steady gait and family member.
